# Patient Record
Sex: Female | Born: 1995
Health system: Southern US, Community
[De-identification: ages and names within clinical notes are randomized; demographics above are authoritative.]

## PROBLEM LIST (undated history)

## (undated) DIAGNOSIS — F909 Attention-deficit hyperactivity disorder, unspecified type: Secondary | ICD-10-CM

## (undated) DIAGNOSIS — A6000 Herpesviral infection of urogenital system, unspecified: Secondary | ICD-10-CM

## (undated) DIAGNOSIS — F5102 Adjustment insomnia: Secondary | ICD-10-CM

## (undated) DIAGNOSIS — N390 Urinary tract infection, site not specified: Secondary | ICD-10-CM

## (undated) HISTORY — DX: Adjustment insomnia: F51.02

## (undated) HISTORY — DX: Herpesviral infection of urogenital system, unspecified: A60.00

## (undated) HISTORY — DX: Attention-deficit hyperactivity disorder, unspecified type: F90.9

## (undated) HISTORY — DX: Urinary tract infection, site not specified: N39.0

---
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
## 2014-11-09 ENCOUNTER — Encounter (INDEPENDENT_AMBULATORY_CARE_PROVIDER_SITE_OTHER): Payer: Self-pay

## 2014-11-09 ENCOUNTER — Ambulatory Visit (INDEPENDENT_AMBULATORY_CARE_PROVIDER_SITE_OTHER): Payer: BLUE CROSS/BLUE SHIELD | Admitting: Family Medicine

## 2014-11-09 ENCOUNTER — Encounter: Payer: Self-pay | Admitting: Family Medicine

## 2014-11-09 VITALS — BP 110/72 | HR 100 | Temp 97.7°F | Resp 16 | Ht 63.0 in | Wt 116.1 lb

## 2014-11-09 DIAGNOSIS — R300 Dysuria: Secondary | ICD-10-CM | POA: Diagnosis not present

## 2014-11-09 DIAGNOSIS — N390 Urinary tract infection, site not specified: Secondary | ICD-10-CM

## 2014-11-09 DIAGNOSIS — G4700 Insomnia, unspecified: Secondary | ICD-10-CM | POA: Diagnosis not present

## 2014-11-09 MED ORDER — TRAZODONE HCL 50 MG PO TABS: 25.0000 mg | ORAL_TABLET | Freq: Every evening | ORAL | Status: DC | PRN

## 2014-11-09 MED ORDER — NITROFURANTOIN 25 MG/5ML PO SUSP: 100.0000 mg | Freq: Every day | ORAL | Status: DC

## 2014-11-09 NOTE — Patient Instructions (Addendum)
Use Nitrofurantoin medication twice a day for 3 days and then back down to 1 time a day then take as needed if having symptoms in the future.  Urinary Tract Infection Urinary tract infections (UTIs) can develop anywhere along your urinary tract. Your urinary tract is your body's drainage system for removing wastes and extra water. Your urinary tract includes two kidneys, two ureters, a bladder, and a urethra. Your kidneys are a pair of bean-shaped organs. Each kidney is about the size of your fist. They are located below your ribs, one on each side of your spine. CAUSES Infections are caused by microbes, which are microscopic organisms, including fungi, viruses, and bacteria. These organisms are so small that they can only be seen through a microscope. Bacteria are the microbes that most commonly cause UTIs. SYMPTOMS  Symptoms of UTIs may vary by age and gender of the patient and by the location of the infection. Symptoms in young women typically include a frequent and intense urge to urinate and a painful, burning feeling in the bladder or urethra during urination. Older women and men are more likely to be tired, shaky, and weak and have muscle aches and abdominal pain. A fever may mean the infection is in your kidneys. Other symptoms of a kidney infection include pain in your back or sides below the ribs, nausea, and vomiting. DIAGNOSIS To diagnose a UTI, your caregiver will ask you about your symptoms. Your caregiver will also ask you to provide a urine sample. The urine sample will be tested for bacteria and white blood cells. White blood cells are made by your body to help fight infection. TREATMENT  Typically, UTIs can be treated with medication. Because most UTIs are caused by a bacterial infection, they usually can be treated with the use of antibiotics. The choice of antibiotic and length of treatment depend on your symptoms and the type of bacteria causing your infection. HOME CARE  INSTRUCTIONS  If you were prescribed antibiotics, take them exactly as your caregiver instructs you. Finish the medication even if you feel better after you have only taken some of the medication.  Drink enough water and fluids to keep your urine clear or pale yellow.  Avoid caffeine, tea, and carbonated beverages. They tend to irritate your bladder.  Empty your bladder often. Avoid holding urine for long periods of time.  Empty your bladder before and after sexual intercourse.  After a bowel movement, women should cleanse from front to back. Use each tissue only once. SEEK MEDICAL CARE IF:   You have back pain.  You develop a fever.  Your symptoms do not begin to resolve within 3 days. SEEK IMMEDIATE MEDICAL CARE IF:   You have severe back pain or lower abdominal pain.  You develop chills.  You have nausea or vomiting.  You have continued burning or discomfort with urination. MAKE SURE YOU:   Understand these instructions.  Will watch your condition.  Will get help right away if you are not doing well or get worse.   This information is not intended to replace advice given to you by your health care provider. Make sure you discuss any questions you have with your health care provider.   Document Released: 10/05/2004 Document Revised: 09/16/2014 Document Reviewed: 02/03/2011 Elsevier Interactive Patient Education [employer]2016 [employer]vier Inc.

## 2014-11-09 NOTE — Progress Notes (Signed)
Name: Allison Oliver   MRN: [id number]    DOB: 03/01/1995   Date:11/09/2014       Progress Note  Subjective  Chief Complaint  Chief Complaint  Patient presents with  . Establish Care  . Medication Refill    HPI  Allison Oliver is a 19 y.o. female here today to transition care of medical needs to a primary care provider. She reports having some urinary symptoms in the past with chronic UTIs and was evaluated by Urology specialists and placed on a medication (name unknown to patient) which she ran out of around March 2016 and since then reports gradual reoccur ance of the same symptoms. Symptoms are not every day but include pelvic pressure, lower back pain, burning at the end of voiding, increased urge and frequency without incomplete voiding or incontinence. Has had cystoscopy and CT scan which did not find kidney stones, pelvic mass or anything abnormal she is aware of.   Otherwise she complains of being anxious and having poor sleep some nights. Feels like she shuts down when she is in a group of people. Tried melatonin OTC did not help much.    Active Ambulatory Problems    Diagnosis Date Noted  . Dysuria 11/09/2014  . Chronic UTI (urinary tract infection) 11/09/2014   Resolved Ambulatory Problems    Diagnosis Date Noted  . No Resolved Ambulatory Problems   Past Medical History  Diagnosis Date  . ADHD (attention deficit hyperactivity disorder)   . Insomnia due to stress     Social History  Substance Use Topics  . Smoking status: Never Smoker   . Smokeless tobacco: Not on file  . Alcohol Use: No    No current outpatient prescriptions on file.  History reviewed. No pertinent past surgical history.  Family History  Problem Relation Age of Onset  . Drug abuse Father     No Known Allergies   Review of Systems  CONSTITUTIONAL: No significant weight changes, fever, chills, weakness or fatigue.  HEENT:  - Eyes: No visual changes.  - Ears: No auditory  changes. No pain.  - Nose: No sneezing, congestion, runny nose. - Throat: No sore throat. No changes in swallowing. SKIN: No rash or itching.  CARDIOVASCULAR: No chest pain, chest pressure or chest discomfort. No palpitations or edema.  RESPIRATORY: No shortness of breath, cough or sputum.  GASTROINTESTINAL: No anorexia, nausea, vomiting. No changes in bowel habits. No abdominal pain or blood.  GENITOURINARY: Yes dysuria. Yes frequency. No discharge. NEUROLOGICAL: No headache, dizziness, syncope, paralysis, ataxia, numbness or tingling in the extremities. No memory changes. No change in bowel or bladder control.  MUSCULOSKELETAL: No joint pain. No muscle pain. HEMATOLOGIC: No anemia, bleeding or bruising.  LYMPHATICS: No enlarged lymph nodes.  PSYCHIATRIC: No change in mood. Yes change in sleep pattern.  ENDOCRINOLOGIC: No reports of sweating, cold or heat intolerance. No polyuria or polydipsia.     Objective  BP 110/72 mmHg  Pulse 100  Temp(Src) 97.7 F (36.5 C) (Oral)  Resp 16  Ht 5\' 3"  (1.6 m)  Wt 116 lb 1.6 oz (52.663 kg)  BMI 20.57 kg/m2  SpO2 97%  LMP 11/02/2014 (Exact Date) Body mass index is 20.57 kg/(m^2).  Physical Exam  Constitutional: Patient appears well-developed and well-nourished. In no distress.  HEENT:  - Head: Normocephalic and atraumatic.  - Ears: Bilateral TMs gray, no erythema or effusion - Nose: Nasal mucosa moist - Mouth/Throat: Oropharynx is clear and moist. No tonsillar hypertrophy or erythema.  No post nasal drainage.  - Eyes: Conjunctivae clear, EOM movements normal. PERRLA. No scleral icterus.  Neck: Normal range of motion. Neck supple. No JVD present. No thyromegaly present.  Cardiovascular: Normal rate, regular rhythm and normal heart sounds.  No murmur heard.  Pulmonary/Chest: Effort normal and breath sounds normal. No respiratory distress. Abdomen: Soft, normal bowel sounds in all 4 quadrants. Some tenderness to suprapubic  palpation. Musculoskeletal: Normal range of motion bilateral UE and LE, no joint effusions. Peripheral vascular: Bilateral LE no edema. Neurological: CN II-XII grossly intact with no focal deficits. Alert and oriented to person, place, and time. Coordination, balance, strength, speech and gait are normal.  Skin: Skin is warm and dry. No rash noted. No erythema.  Psychiatric: Patient has a normal mood and affect. Behavior is normal in office today. Judgment and thought content normal in office today.  Assessment & Plan  1. Dysuria Confirmed previous Rx with [facility]. I discussed my concern that she has been on chronic abx and resistance may build up. I will culture her urine today and will await previous records for review and consider 2nd opinion from urology specialty.  - nitrofurantoin (FURADANTIN) 25 MG/5ML suspension; Take 20 mLs (100 mg total) by mouth at bedtime.  Dispense: 230 mL; Refill: 2 - Urine Culture  2. Chronic UTI (urinary tract infection) Need to review previous records. May need second opinion near future. Consider interstitial cystitis as a diagnosis that needs to be ruled out.  - nitrofurantoin (FURADANTIN) 25 MG/5ML suspension; Take 20 mLs (100 mg total) by mouth at bedtime.  Dispense: 230 mL; Refill: 2  3. Insomnia Sleep hygiene recommended.  - traZODone (DESYREL) 50 MG tablet; Take 0.5-1 tablets (25-50 mg total) by mouth at bedtime as needed for sleep.  Dispense: 30 tablet; Refill: 3

## 2014-11-12 LAB — URINE CULTURE

## 2015-06-14 ENCOUNTER — Encounter: Payer: Self-pay | Admitting: Family Medicine

## 2015-06-14 ENCOUNTER — Ambulatory Visit (INDEPENDENT_AMBULATORY_CARE_PROVIDER_SITE_OTHER): Payer: BLUE CROSS/BLUE SHIELD | Admitting: Family Medicine

## 2015-06-14 VITALS — BP 118/74 | HR 95 | Temp 98.7°F | Resp 14 | Wt 118.0 lb

## 2015-06-14 DIAGNOSIS — O98319 Other infections with a predominantly sexual mode of transmission complicating pregnancy, unspecified trimester: Secondary | ICD-10-CM | POA: Insufficient documentation

## 2015-06-14 DIAGNOSIS — F410 Panic disorder [episodic paroxysmal anxiety] without agoraphobia: Secondary | ICD-10-CM

## 2015-06-14 DIAGNOSIS — B009 Herpesviral infection, unspecified: Secondary | ICD-10-CM | POA: Diagnosis not present

## 2015-06-14 DIAGNOSIS — A6009 Herpesviral infection of other urogenital tract: Secondary | ICD-10-CM | POA: Insufficient documentation

## 2015-06-14 MED ORDER — VALACYCLOVIR HCL 500 MG PO TABS: ORAL_TABLET | ORAL | Status: DC

## 2015-06-14 NOTE — Patient Instructions (Addendum)
Consider L-lysine or B complex stress tabs to help avoid outbreaks Start the new medicine for prevention, but always be safe Check out the [facility] website HairSlick.nohttps://www.cdc.gov/std/herpes/default.htm

## 2015-06-14 NOTE — Progress Notes (Signed)
BP 118/74 mmHg  Pulse 95  Temp(Src) 98.7 F (37.1 C) (Oral)  Resp 14  Wt 118 lb (53.524 kg)  SpO2 96%  LMP 06/14/2015 (Approximate)   Subjective:    Patient ID: Allison Oliver, female    DOB: 04/22/1995, 20 y.o.   MRN: [id number]  HPI: Allison Oliver is a 20 y.o. female  Chief Complaint  Patient presents with  . Exposure to STD    Was diagnosed with herpes through urgent care wants to see about getting on daily medication   She found out a few weeks ago that she had herpes; she went to urgent care; had genital herpes Little bit of discharge still, little discharge and they swabbed her; no results Gigantic blue pills, valacyclovir 1 gram TID for one week She was really worried; waited from Monday to Thursday, just waiting on the results No abuse Not pregnant, started period yesterday Recurrent UTIs; had cystoscopy; had a UTI at urgent care, finished out medicine for that  About two weeks, at a softball game, had ringing noises in her ears; literally thought I was dying she says, sweating, vision got blurry, so scared; went home, told her mom about it; was at a softball game; dropped her phone, almost phone; calmed down when she got in the care; keeps getting a ringing in her ears; never had a panic attack; no idea what it could be; not sure if just worrying herself; heart was just racing; sister has anxiety; she told her sister and she said that's how she felt, thought she had a panic attack  Depression screen [facility] 2/9 06/14/2015 11/09/2014  Decreased Interest 0 0  Down, Depressed, Hopeless 1 0  PHQ - 2 Score 1 0   Relevant past medical, surgical, family and social history reviewed Past Medical History  Diagnosis Date  . ADHD (attention deficit hyperactivity disorder)     history of  . Insomnia due to stress   . Chronic UTI (urinary tract infection)    History reviewed. No pertinent past surgical history. Family History  Problem Relation Age of Onset  . Drug abuse  Father    Social History  Substance Use Topics  . Smoking status: Never Smoker   . Smokeless tobacco: None  . Alcohol Use: No   Interim medical history since last visit reviewed. Allergies and medications reviewed  Review of Systems Per HPI unless specifically indicated above     Objective:    BP 118/74 mmHg  Pulse 95  Temp(Src) 98.7 F (37.1 C) (Oral)  Resp 14  Wt 118 lb (53.524 kg)  SpO2 96%  LMP 06/14/2015 (Approximate)  Wt Readings from Last 3 Encounters:  06/14/15 118 lb (53.524 kg)  11/09/14 116 lb 1.6 oz (52.663 kg)    Physical Exam  Constitutional: She appears well-developed and well-nourished.  Cardiovascular: Normal rate and regular rhythm.   No extrasystoles are present. Exam reveals no gallop and no friction rub.   No murmur heard. Pulmonary/Chest: Effort normal and breath sounds normal.  Neurological: She displays no tremor.  No tics  Skin: No pallor.  Psychiatric: Her mood appears not anxious. She does not exhibit a depressed mood.    Results for orders placed or performed in visit on 11/09/14  Urine Culture  Result Value Ref Range   Urine Culture, Routine Final report (A)    Urine Culture result 1 Escherichia coli (A)    ANTIMICROBIAL SUSCEPTIBILITY Comment       Assessment & Plan:  Problem List Items Addressed This Visit      Other   Panic attack    Sounds very likely that she suffered a panic attack; discussed stress reduction; may return if recurs      Herpes simplex infection - Primary    [facility]; discussed preventive medicine, usually reserved for patients with frequent outbreaks but she wishes to start now; explained can still be spread, but may reduce risk of spread; still reocmmend barrier (condom use) and avoidance of any contact with active outbreak; reiterated can still be spread even on medicine and without active lesions; stress reduction, L-lysine, B stress tabs, etc discussed      Relevant Medications    valACYclovir (VALTREX) 500 MG tablet      Follow up plan: Return in about 12 weeks (around 09/06/2015) [facility] repeat testing [facility]  An after-visit summary was printed and given to the patient at check-out.  Please see the patient instructions which may contain other information and recommendations beyond what is mentioned above in the assessment and plan.  Meds ordered this encounter  Medications  . valACYclovir (VALTREX) 500 MG tablet    Sig: One by mouth daily for prevention; if outbreak, take one pill twice a day for 3 days    Dispense:  33 tablet    Refill:  11

## 2015-07-10 DIAGNOSIS — F410 Panic disorder [episodic paroxysmal anxiety] without agoraphobia: Secondary | ICD-10-CM | POA: Insufficient documentation

## 2015-07-10 NOTE — Assessment & Plan Note (Signed)
[facility]; discussed preventive medicine, usually reserved for patients with frequent outbreaks but she wishes to start now; explained can still be spread, but may reduce risk of spread; still reocmmend barrier (condom use) and avoidance of any contact with active outbreak; reiterated can still be spread even on medicine and without active lesions; stress reduction, L-lysine, B stress tabs, etc discussed

## 2015-07-10 NOTE — Assessment & Plan Note (Signed)
Sounds very likely that she suffered a panic attack; discussed stress reduction; may return if recurs

## 2015-07-12 ENCOUNTER — Encounter: Payer: Self-pay | Admitting: Family Medicine

## 2015-07-15 ENCOUNTER — Encounter: Payer: Self-pay | Admitting: Emergency Medicine

## 2015-07-15 DIAGNOSIS — F1290 Cannabis use, unspecified, uncomplicated: Secondary | ICD-10-CM | POA: Diagnosis not present

## 2015-07-15 DIAGNOSIS — F909 Attention-deficit hyperactivity disorder, unspecified type: Secondary | ICD-10-CM | POA: Insufficient documentation

## 2015-07-15 DIAGNOSIS — R1031 Right lower quadrant pain: Secondary | ICD-10-CM | POA: Diagnosis present

## 2015-07-15 DIAGNOSIS — N12 Tubulo-interstitial nephritis, not specified as acute or chronic: Secondary | ICD-10-CM | POA: Insufficient documentation

## 2015-07-15 LAB — COMPREHENSIVE METABOLIC PANEL
ALBUMIN: 5.2 g/dL — AB (ref 3.5–5.0)
ALT: 25 U/L (ref 14–54)
ANION GAP: 10 (ref 5–15)
AST: 41 U/L (ref 15–41)
Alkaline Phosphatase: 73 U/L (ref 38–126)
BILIRUBIN TOTAL: 1.4 mg/dL — AB (ref 0.3–1.2)
BUN: 12 mg/dL (ref 6–20)
CO2: 26 mmol/L (ref 22–32)
Calcium: 9.8 mg/dL (ref 8.9–10.3)
Chloride: 103 mmol/L (ref 101–111)
Creatinine, Ser: 0.93 mg/dL (ref 0.44–1.00)
GFR calc non Af Amer: >60 mL/min (ref >60)
GLUCOSE: 85 mg/dL (ref 65–99)
POTASSIUM: 4.4 mmol/L (ref 3.5–5.1)
SODIUM: 139 mmol/L (ref 135–145)
TOTAL PROTEIN: 9.3 g/dL — AB (ref 6.5–8.1)

## 2015-07-15 LAB — CBC
HEMATOCRIT: 47.8 % — AB (ref 35.0–47.0)
HEMOGLOBIN: 16.1 g/dL — AB (ref 12.0–16.0)
MCH: 31.3 pg (ref 26.0–34.0)
MCHC: 33.7 g/dL (ref 32.0–36.0)
MCV: 92.8 fL (ref 80.0–100.0)
Platelets: 283 10*3/uL (ref 150–440)
RBC: 5.15 MIL/uL (ref 3.80–5.20)
RDW: 12.9 % (ref 11.5–14.5)
WBC: 13.7 10*3/uL — ABNORMAL HIGH (ref 3.6–11.0)

## 2015-07-15 LAB — URINALYSIS COMPLETE WITH MICROSCOPIC (ARMC ONLY)
BACTERIA UA: NONE SEEN
Bilirubin Urine: NEGATIVE
Glucose, UA: NEGATIVE mg/dL
Ketones, ur: NEGATIVE mg/dL
Nitrite: NEGATIVE
PH: 7 (ref 5.0–8.0)
PROTEIN: 30 mg/dL — AB
Specific Gravity, Urine: 1.015 (ref 1.005–1.030)

## 2015-07-15 LAB — LIPASE, BLOOD: Lipase: 20 U/L (ref 11–51)

## 2015-07-15 LAB — PREGNANCY, URINE: Preg Test, Ur: NEGATIVE

## 2015-07-15 NOTE — ED Notes (Signed)
Pt arrived to the ED accompanied by her mother for complaints of right flank pain. Pt states that the pain started a couple of hours ago and has progressively gotten worse. Pt reports urinary discomfort. Pt is AOx4 in moderate pain distress.

## 2015-07-16 ENCOUNTER — Emergency Department: Payer: BLUE CROSS/BLUE SHIELD

## 2015-07-16 ENCOUNTER — Emergency Department
Admission: EM | Admit: 2015-07-16 | Discharge: 2015-07-16 | Disposition: A | Discharge status: Home / Self Care | Payer: BLUE CROSS/BLUE SHIELD | Attending: Emergency Medicine | Admitting: Emergency Medicine

## 2015-07-16 DIAGNOSIS — N12 Tubulo-interstitial nephritis, not specified as acute or chronic: Secondary | ICD-10-CM

## 2015-07-16 MED ORDER — ONDANSETRON HCL 4 MG/2ML IJ SOLN
4.0000 mg | Freq: Once | INTRAMUSCULAR | Status: AC
  Administered 2015-07-16: 4 mg via INTRAVENOUS
  Filled 2015-07-16: qty 2

## 2015-07-16 MED ORDER — MORPHINE SULFATE (PF) 2 MG/ML IV SOLN
2.0000 mg | Freq: Once | INTRAVENOUS | Status: AC
Start: 2015-07-16 — End: 2015-07-16
  Administered 2015-07-16: 2 mg via INTRAVENOUS
  Filled 2015-07-16: qty 1

## 2015-07-16 MED ORDER — CIPROFLOXACIN HCL 500 MG PO TABS
500.0000 mg | ORAL_TABLET | Freq: Two times a day (BID) | ORAL | Status: AC
Start: 2015-07-16 — End: 2015-07-26

## 2015-07-16 MED ORDER — DIATRIZOATE MEGLUMINE & SODIUM 66-10 % PO SOLN
15.0000 mL | ORAL | Status: AC
  Administered 2015-07-16: 15 mL via ORAL

## 2015-07-16 MED ORDER — IOPAMIDOL (ISOVUE-300) INJECTION 61%
100.0000 mL | Freq: Once | INTRAVENOUS | Status: AC | PRN
  Administered 2015-07-16: 100 mL via INTRAVENOUS

## 2015-07-16 MED ORDER — DEXTROSE 5 % IV SOLN
1.0000 g | Freq: Once | INTRAVENOUS | Status: AC
  Administered 2015-07-16: 1 g via INTRAVENOUS
  Filled 2015-07-16: qty 10

## 2015-07-16 NOTE — Discharge Instructions (Signed)

## 2015-07-16 NOTE — ED Notes (Signed)
Pt tender to palpation of right lower abdomen, and mildly tender to right mid/lateral back

## 2015-07-16 NOTE — ED Provider Notes (Signed)
[facility] Emergency Department Provider Note  ____________________________________________  Time seen: 1:45 AM  I have reviewed the triage vital signs and the nursing notes.   HISTORY  Chief Complaint Flank Pain      HPI Allison Oliver is a 20 y.o. female presents with right flank pain currently 10 out of 10 with acute onset 2 hours before presentation to the emergency department. Patient admits to nausea denies any vomiting. Patient admits to dysuria one week ago that has since resolved but reoccurred today. Patient admits to chills earlier this week with subjective fevers.     Past Medical History  Diagnosis Date  . ADHD (attention deficit hyperactivity disorder)     history of  . Insomnia due to stress   . Chronic UTI (urinary tract infection)     Patient Active Problem List   Diagnosis Date Noted  . Panic attack 07/10/2015  . Herpes simplex infection 06/14/2015  . Chronic UTI (urinary tract infection) 11/09/2014  . Insomnia 11/09/2014    History reviewed. No pertinent past surgical history.  Current Outpatient Rx  Name  Route  Sig  Dispense  Refill  . valACYclovir (VALTREX) 500 MG tablet      One by mouth daily for prevention; if outbreak, take one pill twice a day for 3 days   33 tablet   11     Allergies No known drug allergies  Family History  Problem Relation Age of Onset  . Drug abuse Father     Social History Social History  Substance Use Topics  . Smoking status: Never Smoker   . Smokeless tobacco: None  . Alcohol Use: 0.0 oz/week    0 Standard drinks or equivalent per week    Review of Systems  Constitutional: Negative for fever. Eyes: Negative for visual changes. ENT: Negative for sore throat. Cardiovascular: Negative for chest pain. Respiratory: Negative for shortness of breath. Gastrointestinal: Positive for abdominal pain Genitourinary: Positive for dysuria. Musculoskeletal: Negative for back  pain. Skin: Negative for rash. Neurological: Negative for headaches, focal weakness or numbness.   10-point ROS otherwise negative.  ____________________________________________   PHYSICAL EXAM:  VITAL SIGNS: ED Triage Vitals  Enc Vitals Group     BP 07/15/15 2242 144/96 mmHg     Pulse Rate 07/15/15 2242 132     Resp 07/15/15 2242 18     Temp 07/15/15 2242 98.4 F (36.9 C)     Temp Source 07/15/15 2242 Oral     SpO2 07/15/15 2242 100 %     Weight 07/15/15 2242 118 lb (53.524 kg)     Height 07/15/15 2242 5\' 1"  (1.549 m)     Head Cir --      Peak Flow --      Pain Score 07/15/15 2243 8     Pain Loc --      Pain Edu? --      Excl. in GC? --     Constitutional: Alert and oriented. Well appearing and in no distress. Eyes: Conjunctivae are normal. PERRL. Normal extraocular movements. ENT   Head: Normocephalic and atraumatic.   Nose: No congestion/rhinnorhea.   Mouth/Throat: Mucous membranes are moist.   Neck: No stridor. Hematological/Lymphatic/Immunilogical: No cervical lymphadenopathy. Cardiovascular: Normal rate, regular rhythm. Normal and symmetric distal pulses are present in all extremities. No murmurs, rubs, or gallops. Respiratory: Normal respiratory effort without tachypnea nor retractions. Breath sounds are clear and equal bilaterally. No wheezes/rales/rhonchi. Gastrointestinal: Right upper quadrant/right lower quadrant tenderness to palpation  No distention. There is no CVA tenderness. Genitourinary: deferred Musculoskeletal: Nontender with normal range of motion in all extremities. No joint effusions.  No lower extremity tenderness nor edema. Neurologic:  Normal speech and language. No gross focal neurologic deficits are appreciated. Speech is normal.  Skin:  Skin is warm, dry and intact. No rash noted. Psychiatric: Mood and affect are normal. Speech and behavior are normal. Patient exhibits appropriate insight and  judgment.  ____________________________________________    LABS (pertinent positives/negatives)  Labs Reviewed  COMPREHENSIVE METABOLIC PANEL - Abnormal; Notable for the following:    Total Protein 9.3 (*)    Albumin 5.2 (*)    Total Bilirubin 1.4 (*)    All other components within normal limits  CBC - Abnormal; Notable for the following:    WBC 13.7 (*)    Hemoglobin 16.1 (*)    HCT 47.8 (*)    All other components within normal limits  URINALYSIS COMPLETEWITH MICROSCOPIC ([facility] ONLY) - Abnormal; Notable for the following:    Color, Urine YELLOW (*)    APPearance HAZY (*)    Hgb urine dipstick 3+ (*)    Protein, ur 30 (*)    Leukocytes, UA 1+ (*)    Squamous Epithelial / LPF 0-5 (*)    All other components within normal limits  LIPASE, BLOOD  PREGNANCY, URINE       RADIOLOGY T Abdomen Pelvis W Contrast (Final result) Result time: 07/16/15 04:04:51   Final result by Rad Results In Interface (07/16/15 04:04:51)   Narrative:   CLINICAL DATA: Right lower quadrant pain and nausea. Fever and chills.  EXAM: CT ABDOMEN AND PELVIS WITH CONTRAST  TECHNIQUE: Multidetector CT imaging of the abdomen and pelvis was performed using the standard protocol following bolus administration of intravenous contrast.  CONTRAST: 100mL ISOVUE-300 IOPAMIDOL (ISOVUE-300) INJECTION 61%  COMPARISON: CT 12/20/2012  FINDINGS: Lower chest: The included lung bases are clear. No pleural fluid.  Liver: Normal.  Hepatobiliary: Gallbladder physiologically distended, no calcified stone. No biliary dilatation.  Pancreas: Normal.  Spleen: Normal.  Adrenal glands: No nodule.  Kidneys: Right perinephric edema and ureteral enhancement. No perirenal or intrarenal fluid collection or abscess. Mildly heterogeneous perfusion of the right kidney. No frank hydronephrosis. Left kidney is normal.  Stomach/Bowel: Stomach is distended with ingested contrast. There are no dilated or  thickened small bowel loops. Small volume of stool throughout the colon without colonic wall thickening.  The appendix is normal.  Vascular/Lymphatic: No retroperitoneal adenopathy. Abdominal aorta is normal in caliber.  Reproductive: Uterus appears normal. Ovaries symmetric in size. Tampon in place. Small volume free fluid is physiologic.  Bladder: Physiologically distended without wall thickening.  Other: No free air, abdominal ascites, or intra-abdominal fluid collection.  Musculoskeletal: There are no acute or suspicious osseous abnormalities.  IMPRESSION: 1. Right pyelonephritis. 2. Normal appendix.   Electronically Signed By: Rubye OaksMelanie Ehinger M.D. On: 07/16/2015 04:04     Procedures     INITIAL IMPRESSION / ASSESSMENT AND PLAN / ED COURSE  Pertinent labs & imaging results that were available during my care of the patient were reviewed by me and considered in my medical decision making (see chart for details).   Patient given IV ceftriaxone 1 g emergency department will be prescribed Cipro for home. I spoke with the patient and her mother at length regarding warning signs for return to the emergency department ____________________________________________   FINAL CLINICAL IMPRESSION(S) / ED DIAGNOSES  Final diagnoses:  Emphysematous pyelonephritis of right kidney (HCC)  Darci Current, MD 07/16/15 [phone]

## 2015-07-16 NOTE — ED Notes (Signed)
Pt reports her abdominal pain decreases when she applies pressure to her right lower abdomen, and increases when she releases the pressure. Pt reports she has been applying pressure to her abdomen all day to help with pain

## 2015-07-16 NOTE — ED Notes (Signed)
Patient transported to CT 

## 2015-07-16 NOTE — ED Notes (Signed)
MD Brown at bedside.

## 2015-07-16 NOTE — ED Notes (Signed)
Pt c/o of right flank - mid/lateral abdominal pain. Pt reports hx of UTI. Pt reports pain began today. Pt reports having fever/chills earlier in the week

## 2015-09-07 ENCOUNTER — Ambulatory Visit (INDEPENDENT_AMBULATORY_CARE_PROVIDER_SITE_OTHER): Payer: BLUE CROSS/BLUE SHIELD | Admitting: Family Medicine

## 2015-09-07 ENCOUNTER — Encounter: Payer: Self-pay | Admitting: Family Medicine

## 2015-09-07 VITALS — BP 112/72 | HR 86 | Temp 97.8°F | Wt 115.0 lb

## 2015-09-07 DIAGNOSIS — Z114 Encounter for screening for human immunodeficiency virus [HIV]: Secondary | ICD-10-CM | POA: Diagnosis not present

## 2015-09-07 DIAGNOSIS — R300 Dysuria: Secondary | ICD-10-CM | POA: Diagnosis not present

## 2015-09-07 DIAGNOSIS — Z113 Encounter for screening for infections with a predominantly sexual mode of transmission: Secondary | ICD-10-CM | POA: Insufficient documentation

## 2015-09-07 DIAGNOSIS — Z23 Encounter for immunization: Secondary | ICD-10-CM | POA: Diagnosis not present

## 2015-09-07 DIAGNOSIS — L708 Other acne: Secondary | ICD-10-CM

## 2015-09-07 DIAGNOSIS — L709 Acne, unspecified: Secondary | ICD-10-CM | POA: Insufficient documentation

## 2015-09-07 DIAGNOSIS — M26629 Arthralgia of temporomandibular joint, unspecified side: Secondary | ICD-10-CM

## 2015-09-07 DIAGNOSIS — B009 Herpesviral infection, unspecified: Secondary | ICD-10-CM

## 2015-09-07 MED ORDER — NITROFURANTOIN MONOHYD MACRO 100 MG PO CAPS: 100.0000 mg | ORAL_CAPSULE | Freq: Two times a day (BID) | ORAL | 0 refills | Status: AC

## 2015-09-07 NOTE — Assessment & Plan Note (Signed)
Check GC, chl

## 2015-09-07 NOTE — Assessment & Plan Note (Addendum)
Refer to dermatologist at patient's request; avoid picking and popping; use soap (mild) and water after exercise

## 2015-09-07 NOTE — Progress Notes (Signed)
BP 112/72   Pulse 86   Temp 97.8 F (36.6 C) (Oral)   Wt 115 lb (52.2 kg)   LMP 09/04/2015   SpO2 99%   BMI 21.73 kg/m    Subjective:    Patient ID: Allison EwingsElizabeth B Bacorn, female    DOB: 06/28/1995, 20 y.o.   MRN: [id number]  HPI: Allison Oliver is a 20 y.o. female  Chief Complaint  Patient presents with  . Follow-up    resting for herpes   She has been having pain in the left ear; hurts to chew food on the left, popping and uncomfortable; two weeks duration; nothing similar before; wondered if wisdom teeth coming in; can hear fine; no fever; no sinus drainage  She does not break out usually, but having acne and fine bumps on the cheeks and forehead; would like referral to dermatologist; sometimes arms; does exercise  She thinks she has a UTI; has had them so she knows when she gets them; she went to the ER and had a kidney infection; has been taking AZO; no flank pain, no suprapubic pressure; was in so much pain with the July episode; side was just hurting so bad; went to the ER; reviewed urine  She has had two small outbreaks since last visit  [facility] 09/07/2015 06/14/2015 11/09/2014  Decreased Interest 0 0 0  Down, Depressed, Hopeless 0 1 0  PHQ - 2 Score 0 1 0   Relevant past medical, surgical, family and social history reviewed Past Medical History:  Diagnosis Date  . ADHD (attention deficit hyperactivity disorder)    history of  . Chronic UTI (urinary tract infection)   . Insomnia due to stress    No past surgical history on file. Family History  Problem Relation Age of Onset  . Drug abuse Father    Social History  Substance Use Topics  . Smoking status: Never Smoker  . Smokeless tobacco: Not on file  . Alcohol use 0.0 oz/week   Interim medical history since last visit reviewed. Allergies and medications reviewed  Review of Systems Per HPI unless specifically indicated above     Objective:    BP 112/72   Pulse 86   Temp 97.8 F  (36.6 C) (Oral)   Wt 115 lb (52.2 kg)   LMP 09/04/2015   SpO2 99%   BMI 21.73 kg/m   Wt Readings from Last 3 Encounters:  09/07/15 115 lb (52.2 kg)  07/15/15 118 lb (53.5 kg)  06/14/15 118 lb (53.5 kg)    Physical Exam  Constitutional: She appears well-developed and well-nourished.  HENT:  Head: Normocephalic and atraumatic.  Right Ear: Hearing, tympanic membrane, external ear and ear canal normal. No drainage. Tympanic membrane is not erythematous. No middle ear effusion.  Left Ear: Hearing, tympanic membrane, external ear and ear canal normal. No drainage. Tympanic membrane is not erythematous.  No middle ear effusion.  Nose: Nose normal. No mucosal edema or rhinorrhea.  Mouth/Throat: Oropharynx is clear and moist and mucous membranes are normal. No oral lesions. Normal dentition. No dental caries. No oropharyngeal exudate.  Left side jaw/TMJ closes slightly before the right side; no click  Eyes: EOM are normal. No scleral icterus.  Cardiovascular: Normal rate and regular rhythm.   Pulmonary/Chest: Effort normal and breath sounds normal.  Lymphadenopathy:    She has no cervical adenopathy.  Skin:  Very fine papular acne on cheeks  Psychiatric: She has a normal mood and affect. Her behavior is  normal.   Results for orders placed or performed during the hospital encounter of 07/16/15  Comprehensive metabolic panel  Result Value Ref Range   Sodium 139 135 - 145 mmol/L   Potassium 4.4 3.5 - 5.1 mmol/L   Chloride 103 101 - 111 mmol/L   CO2 26 22 - 32 mmol/L   Glucose, Bld 85 65 - 99 mg/dL   BUN 12 6 - 20 mg/dL   Creatinine, Ser 5.78 0.44 - 1.00 mg/dL   Calcium 9.8 8.9 - 46.9 mg/dL   Total Protein 9.3 (H) 6.5 - 8.1 g/dL   Albumin 5.2 (H) 3.5 - 5.0 g/dL   AST 41 15 - 41 U/L   ALT 25 14 - 54 U/L   Alkaline Phosphatase 73 38 - 126 U/L   Total Bilirubin 1.4 (H) 0.3 - 1.2 mg/dL   GFR calc non Af Amer >60 >60 mL/min   GFR calc Af Amer >60 >60 mL/min   Anion gap 10 5 - 15  CBC    Result Value Ref Range   WBC 13.7 (H) 3.6 - 11.0 K/uL   RBC 5.15 3.80 - 5.20 MIL/uL   Hemoglobin 16.1 (H) 12.0 - 16.0 g/dL   HCT 62.9 (H) 52.8 - 41.3 %   MCV 92.8 80.0 - 100.0 fL   MCH 31.3 26.0 - 34.0 pg   MCHC 33.7 32.0 - 36.0 g/dL   RDW 24.4 01.0 - 27.2 %   Platelets 283 150 - 440 K/uL  Lipase, blood  Result Value Ref Range   Lipase 20 11 - 51 U/L  Urinalysis complete, with microscopic ([facility] only)  Result Value Ref Range   Color, Urine YELLOW (A) YELLOW   APPearance HAZY (A) CLEAR   Glucose, UA NEGATIVE NEGATIVE mg/dL   Bilirubin Urine NEGATIVE NEGATIVE   Ketones, ur NEGATIVE NEGATIVE mg/dL   Specific Gravity, Urine 1.015 1.005 - 1.030   Hgb urine dipstick 3+ (A) NEGATIVE   pH 7.0 5.0 - 8.0   Protein, ur 30 (A) NEGATIVE mg/dL   Nitrite NEGATIVE NEGATIVE   Leukocytes, UA 1+ (A) NEGATIVE   RBC / HPF TOO NUMEROUS TO COUNT 0 - 5 RBC/hpf   WBC, UA TOO NUMEROUS TO COUNT 0 - 5 WBC/hpf   Bacteria, UA NONE SEEN NONE SEEN   Squamous Epithelial / LPF 0-5 (A) NONE SEEN   Mucous PRESENT   Pregnancy, urine  Result Value Ref Range   Preg Test, Ur NEGATIVE NEGATIVE      Assessment & Plan:   Problem List Items Addressed This Visit      Musculoskeletal and Integument   Acne    Refer to dermatologist at patient's request; avoid picking and popping; use soap (mild) and water after exercise      Relevant Orders   Ambulatory referral to Dermatology     Other   Screen for STD (sexually transmitted disease)    Check GC, chl      Relevant Orders   GC/chlamydia probe amp, urine   Herpes simplex infection    Continue valtrex; consider B complex or L-lysine      Encounter for screening for HIV    Screen HIV      Relevant Orders   HIV antibody   Dysuria - Primary    Check urine and culture; start antibiotics      Relevant Medications   nitrofurantoin, macrocrystal-monohydrate, (MACROBID) 100 MG capsule   Other Relevant Orders   Urinalysis w microscopic + reflex cultur  Other Visit Diagnoses    Needs flu shot       Relevant Orders   Flu Vaccine QUAD 36+ mos PF IM (Fluarix & Fluzone Quad PF) (Completed)   TMJ pain dysfunction syndrome       may be TMJ or wisdom tooth; patient to see her dentist      Follow up plan: Return if symptoms worsen or fail to improve.  An after-visit summary was printed and given to the patient at check-out.  Please see the patient instructions which may contain other information and recommendations beyond what is mentioned above in the assessment and plan.  Meds ordered this encounter  Medications  . nitrofurantoin, macrocrystal-monohydrate, (MACROBID) 100 MG capsule    Sig: Take 1 capsule (100 mg total) by mouth 2 (two) times daily.    Dispense:  6 capsule    Refill:  0    Orders Placed This Encounter  Procedures  . Flu Vaccine QUAD 36+ mos PF IM (Fluarix & Fluzone Quad PF)  . Urinalysis w microscopic + reflex cultur  . HIV antibody  . GC/chlamydia probe amp, urine  . Ambulatory referral to Dermatology

## 2015-09-07 NOTE — Patient Instructions (Addendum)
Try cranberry pills or juice to help prevent urinary tract infection Post-coital voiding  Drink 64-72 ounces of water a day Consider B complex vitamins or L-lysine to help decrease outbreaks Continue Valtrex Start the antibiotic Please do eat yogurt daily or take a probiotic daily for the next month or two We want to replace the healthy germs in the gut If you notice foul, watery diarrhea in the next two months, schedule an appointment RIGHT AWAY Try Aleve or ibuprofen for the jaw discomfort Do see your dentist about the TMJ/jaw discomfort I've referred you to a dermatologist If you have not heard anything from my staff in a week about any orders/referrals/studies from today, please contact us here to follow-up [phone]

## 2015-09-07 NOTE — Assessment & Plan Note (Signed)
Screen HIV

## 2015-09-07 NOTE — Assessment & Plan Note (Signed)
Check urine and culture; start antibiotics

## 2015-09-07 NOTE — Assessment & Plan Note (Signed)
Continue valtrex; consider B complex or L-lysine

## 2015-09-08 ENCOUNTER — Other Ambulatory Visit: Payer: Self-pay | Admitting: Family Medicine

## 2015-09-08 LAB — URINALYSIS W MICROSCOPIC + REFLEX CULTURE
Bilirubin Urine: NEGATIVE
Casts: NONE SEEN [LPF]
Crystals: NONE SEEN [HPF]
Glucose, UA: NEGATIVE
Ketones, ur: NEGATIVE
Nitrite: POSITIVE — AB
Protein, ur: NEGATIVE
Specific Gravity, Urine: 1.01 (ref 1.001–1.035)
Yeast: NONE SEEN [HPF]
pH: 5.5 (ref 5.0–8.0)

## 2015-09-08 LAB — GC/CHLAMYDIA PROBE AMP
CT Probe RNA: NOT DETECTED
GC Probe RNA: NOT DETECTED

## 2015-09-09 LAB — HIV ANTIBODY (ROUTINE TESTING W REFLEX): HIV SCREEN 4TH GENERATION: NONREACTIVE

## 2015-09-10 LAB — URINE CULTURE

## 2016-05-23 ENCOUNTER — Other Ambulatory Visit: Payer: Self-pay | Admitting: Family Medicine

## 2016-05-23 ENCOUNTER — Ambulatory Visit (INDEPENDENT_AMBULATORY_CARE_PROVIDER_SITE_OTHER): Payer: BLUE CROSS/BLUE SHIELD | Admitting: Family Medicine

## 2016-05-23 ENCOUNTER — Encounter: Payer: Self-pay | Admitting: Family Medicine

## 2016-05-23 VITALS — BP 112/64 | HR 97 | Temp 98.0°F | Resp 14 | Wt 109.5 lb

## 2016-05-23 DIAGNOSIS — Z113 Encounter for screening for infections with a predominantly sexual mode of transmission: Secondary | ICD-10-CM

## 2016-05-23 DIAGNOSIS — Z124 Encounter for screening for malignant neoplasm of cervix: Secondary | ICD-10-CM

## 2016-05-23 DIAGNOSIS — N898 Other specified noninflammatory disorders of vagina: Secondary | ICD-10-CM

## 2016-05-23 DIAGNOSIS — N390 Urinary tract infection, site not specified: Secondary | ICD-10-CM

## 2016-05-23 DIAGNOSIS — Z0000 Encounter for general adult medical examination without abnormal findings: Secondary | ICD-10-CM | POA: Diagnosis not present

## 2016-05-23 DIAGNOSIS — B009 Herpesviral infection, unspecified: Secondary | ICD-10-CM

## 2016-05-23 LAB — COMPLETE METABOLIC PANEL WITH GFR
ALBUMIN: 4.4 g/dL (ref 3.6–5.1)
ALK PHOS: 43 U/L (ref 33–115)
ALT: 8 U/L (ref 6–29)
AST: 12 U/L (ref 10–30)
BUN: 14 mg/dL (ref 7–25)
CALCIUM: 9.4 mg/dL (ref 8.6–10.2)
CO2: 19 mmol/L — ABNORMAL LOW (ref 20–31)
Chloride: 107 mmol/L (ref 98–110)
Creat: 0.78 mg/dL (ref 0.50–1.10)
GFR, Est African American: >89 mL/min (ref >=60)
GLUCOSE: 95 mg/dL (ref 65–99)
POTASSIUM: 3.5 mmol/L (ref 3.5–5.3)
SODIUM: 139 mmol/L (ref 135–146)
Total Bilirubin: 0.7 mg/dL (ref 0.2–1.2)
Total Protein: 7.3 g/dL (ref 6.1–8.1)

## 2016-05-23 LAB — LIPID PANEL
CHOL/HDL RATIO: 2 ratio (ref <5.0)
Cholesterol: 146 mg/dL (ref <200)
HDL: 74 mg/dL (ref >50)
LDL Cholesterol: 47 mg/dL (ref <100)
Triglycerides: 123 mg/dL (ref <150)
VLDL: 25 mg/dL (ref <30)

## 2016-05-23 MED ORDER — NITROFURANTOIN MACROCRYSTAL 50 MG PO CAPS: 50.0000 mg | ORAL_CAPSULE | Freq: Every day | ORAL | 5 refills | Status: DC | PRN

## 2016-05-23 MED ORDER — B COMPLEX PO TABS: 1.0000 | ORAL_TABLET | Freq: Every day | ORAL | 11 refills | Status: DC

## 2016-05-23 NOTE — Assessment & Plan Note (Signed)
screen 

## 2016-05-23 NOTE — Progress Notes (Signed)
Patient ID: Allison Oliver, female   DOB: [birth year], 21 y.o.   MRN: [id number]   Subjective:   Allison Oliver is a 21 y.o. female here for a complete physical exam  Interim issues since last visit: here for physical  USPSTF grade A and B recommendations Depression:  Depression screen [facility] 2/9 05/23/2016 09/07/2015 06/14/2015 11/09/2014  Decreased Interest 0 0 0 0  Down, Depressed, Hopeless 0 0 1 0  PHQ - 2 Score 0 0 1 0  Altered sleeping 0 - - -  Tired, decreased energy 0 - - -  Change in appetite 0 - - -  Feeling bad or failure about yourself  0 - - -  Trouble concentrating 0 - - -  Moving slowly or fidgety/restless 0 - - -  Suicidal thoughts 0 - - -  PHQ-9 Score 0 - - -   Hypertension: BP Readings from Last 3 Encounters:  05/23/16 112/64  09/07/15 112/72  07/16/15 135/90   Obesity: fluctuates between 107 here; works out a lot; never starves herself [employer] from Last 3 Encounters:  05/23/16 109 lb 8 oz (49.7 kg)  09/07/15 115 lb (52.2 kg)  07/15/15 118 lb (53.5 kg)   BMI Readings from Last 3 Encounters:  05/23/16 20.69 kg/m  09/07/15 21.73 kg/m  07/15/15 22.30 kg/m    Alcohol: safe Tobacco use: no HIV, hep B, hep C: HIV negative 2017, recheck, new boyfriend STD testing and prevention (chl/gon/syphilis): GC/CHL negative 2017; recheck Intimate partner violence: no abuse Breast cancer: no lumps BRCA gene screening: no breast/ovarian cancer Cervical cancer screening: UTD; she cannot remember so would prefer to have today; Dr. Marchia Bond Lipids: check today, water and cheesedog earlier Lab Results  Component Value Date   CHOL 146 05/23/2016   Lab Results  Component Value Date   HDL 74 05/23/2016   Lab Results  Component Value Date   LDLCALC 47 05/23/2016   Lab Results  Component Value Date   TRIG 123 05/23/2016   Lab Results  Component Value Date   CHOLHDL 2.0 05/23/2016   No results found for: LDLDIRECT Glucose: check nonfasting  today Glucose, Bld  Date Value Ref Range Status  05/23/2016 95 65 - 99 mg/dL Final  07/15/2015 85 65 - 99 mg/dL Final   Colorectal cancer: no fam hx Diet: drinking lots of water; not much calcium Exercise: works out 5x a week Skin cancer: went to dermatologist, freckle on lip; checked out, benign  Recurrent UTIs; 2-3 a month Herpes infections, some menses related; once a month; takes medicine BID when she gets flares  Past Medical History:  Diagnosis Date  . ADHD (attention deficit hyperactivity disorder)    history of  . Chronic UTI (urinary tract infection)   . Herpes genitalia   . Insomnia due to stress    No past surgical history on file.   Family History  Problem Relation Age of Onset  . Drug abuse Father   . Cervical cancer Sister    Social History  Substance Use Topics  . Smoking status: Current Some Day Smoker    Types: E-cigarettes  . Smokeless tobacco: Never Used  . Alcohol use 0.0 oz/week   Review of Systems  Objective:   Vitals:   05/23/16 1406  BP: 112/64  Pulse: 97  Resp: 14  Temp: 98 F (36.7 C)  TempSrc: Oral  SpO2: 98%  Weight: 109 lb 8 oz (49.7 kg)   Body mass index is 20.69 kg/m. Wt  Readings from Last 3 Encounters:  05/23/16 109 lb 8 oz (49.7 kg)  09/07/15 115 lb (52.2 kg)  07/15/15 118 lb (53.5 kg)   Physical Exam  Constitutional: She appears well-developed and well-nourished.  HENT:  Head: Normocephalic and atraumatic.  Eyes: Conjunctivae and EOM are normal. Right eye exhibits no hordeolum. Left eye exhibits no hordeolum. No scleral icterus.  Neck: Carotid bruit is not present. No thyromegaly present.  Cardiovascular: Normal rate, regular rhythm, S1 normal, S2 normal and normal heart sounds.   No extrasystoles are present.  Pulmonary/Chest: Effort normal and breath sounds normal. No respiratory distress. Right breast exhibits no inverted nipple, no mass, no nipple discharge, no skin change and no tenderness. Left breast exhibits no  inverted nipple, no mass, no nipple discharge, no skin change and no tenderness. Breasts are symmetrical.  Abdominal: Soft. Normal appearance and bowel sounds are normal. She exhibits no distension, no abdominal bruit, no pulsatile midline mass and no mass. There is no hepatosplenomegaly. There is no tenderness. No hernia.  Genitourinary: Uterus normal. Pelvic exam was performed with patient prone. There is no rash or lesion on the right labia. There is no rash or lesion on the left labia. Cervix exhibits no motion tenderness and no discharge. Right adnexum displays no mass, no tenderness and no fullness. Left adnexum displays no mass, no tenderness and no fullness. Vaginal discharge (whitish discharge, no strong odor) found.  Genitourinary Comments: Cervix was difficult to find, deep (posterior) in the pelvis  Musculoskeletal: Normal range of motion. She exhibits no edema.  Lymphadenopathy:       Head (right side): No submandibular adenopathy present.       Head (left side): No submandibular adenopathy present.    She has no cervical adenopathy.    She has no axillary adenopathy.  Neurological: She is alert. She displays no tremor. No cranial nerve deficit. She exhibits normal muscle tone. Gait normal.  Skin: Skin is warm and dry. No bruising and no ecchymosis noted. No cyanosis. No pallor.  Psychiatric: Her speech is normal and behavior is normal. Thought content normal. Her mood appears not anxious. She does not exhibit a depressed mood.    Assessment/Plan:   Problem List Items Addressed This Visit      Genitourinary   Chronic UTI (urinary tract infection)    Start postcoital prophylaxis, void after intercourse      Relevant Medications   nitrofurantoin (MACRODANTIN) 50 MG capsule     Other   Screening for cervical cancer    Check today      Relevant Orders   Pap IG, CT/NG w/ reflex HPV when ASC-U   Screen for STD (sexually transmitted disease)    screen      Relevant Orders    RPR (Completed)   Hepatitis panel, acute (Completed)   HIV antibody (Completed)   Preventative health care    USPSTF grade A and B recommendations reviewed with patient; age-appropriate recommendations, preventive care, screening tests, etc discussed and encouraged; healthy living encouraged; see AVS for patient education given to patient       Relevant Orders   Lipid panel (Completed)   COMPLETE METABOLIC PANEL WITH GFR (Completed)   TSH (Completed)   Herpes simplex infection    Continue valtrex, add B complex vitamins       Other Visit Diagnoses    White vaginal discharge    -  Primary   Relevant Orders   WET PREP BY MOLECULAR PROBE (Completed)  Meds ordered this encounter  Medications  . nitrofurantoin (MACRODANTIN) 50 MG capsule    Sig: Take 1 capsule (50 mg total) by mouth daily as needed.    Dispense:  30 capsule    Refill:  5  . b complex vitamins tablet    Sig: Take 1 tablet by mouth daily.    Dispense:  30 tablet    Refill:  11   Orders Placed This Encounter  Procedures  . WET PREP BY MOLECULAR PROBE  . RPR  . Hepatitis panel, acute  . HIV antibody  . Lipid panel  . COMPLETE METABOLIC PANEL WITH GFR  . TSH    Follow up plan: Return in about 1 year (around 05/23/2017) for complete physical.  An After Visit Summary was printed and given to the patient.

## 2016-05-23 NOTE — Assessment & Plan Note (Signed)
USPSTF grade A and B recommendations reviewed with patient; age-appropriate recommendations, preventive care, screening tests, etc discussed and encouraged; healthy living encouraged; see AVS for patient education given to patient  

## 2016-05-23 NOTE — Assessment & Plan Note (Signed)
Continue valtrex, add B complex vitamins

## 2016-05-23 NOTE — Assessment & Plan Note (Signed)
Check today 

## 2016-05-23 NOTE — Assessment & Plan Note (Signed)
Start postcoital prophylaxis, void after intercourse

## 2016-05-23 NOTE — Patient Instructions (Addendum)
Try almond milk, increase calcium intake in leafy greens Start B complex vitamins  Health Maintenance, Female Adopting a healthy lifestyle and getting preventive care can go a long way to promote health and wellness. Talk with your health care provider about what schedule of regular examinations is right for you. This is a good chance for you to check in with your provider about disease prevention and staying healthy. In between checkups, there are plenty of things you can do on your own. Experts have done a lot of research about which lifestyle changes and preventive measures are most likely to keep you healthy. Ask your health care provider for more information. Weight and diet Eat a healthy diet  Be sure to include plenty of vegetables, fruits, low-fat dairy products, and lean protein.  Do not eat a lot of foods high in solid fats, added sugars, or salt.  Get regular exercise. This is one of the most important things you can do for your health.  Most adults should exercise for at least 150 minutes each week. The exercise should increase your heart rate and make you sweat (moderate-intensity exercise).  Most adults should also do strengthening exercises at least twice a week. This is in addition to the moderate-intensity exercise. Maintain a healthy weight  Body mass index (BMI) is a measurement that can be used to identify possible weight problems. It estimates body fat based on height and weight. Your health care provider can help determine your BMI and help you achieve or maintain a healthy weight.  For females 93 years of age and older:  A BMI below 18.5 is considered underweight.  A BMI of 18.5 to 24.9 is normal.  A BMI of 25 to 29.9 is considered overweight.  A BMI of 30 and above is considered obese. Watch levels of cholesterol and blood lipids  You should start having your blood tested for lipids and cholesterol at 22 years of age, then have this test every 5 years.  You  may need to have your cholesterol levels checked more often if:  Your lipid or cholesterol levels are high.  You are older than 22 years of age.  You are at high risk for heart disease. Cancer screening Lung Cancer  Lung cancer screening is recommended for adults 91-83 years old who are at high risk for lung cancer because of a history of smoking.  A yearly low-dose CT scan of the lungs is recommended for people who:  Currently smoke.  Have quit within the past 15 years.  Have at least a 30-pack-year history of smoking. A pack year is smoking an average of one pack of cigarettes a day for 1 year.  Yearly screening should continue until it has been 15 years since you quit.  Yearly screening should stop if you develop a health problem that would prevent you from having lung cancer treatment. Breast Cancer  Practice breast self-awareness. This means understanding how your breasts normally appear and feel.  It also means doing regular breast self-exams. Let your health care provider know about any changes, no matter how small.  If you are in your 20s or 30s, you should have a clinical breast exam (CBE) by a health care provider every 1-3 years as part of a regular health exam.  If you are 1 or older, have a CBE every year. Also consider having a breast X-ray (mammogram) every year.  If you have a family history of breast cancer, talk to your health care provider  about genetic screening.  If you are at high risk for breast cancer, talk to your health care provider about having an MRI and a mammogram every year.  Breast cancer gene (BRCA) assessment is recommended for women who have family members with BRCA-related cancers. BRCA-related cancers include:  Breast.  Ovarian.  Tubal.  Peritoneal cancers.  Results of the assessment will determine the need for genetic counseling and BRCA1 and BRCA2 testing. Cervical Cancer  Your health care provider may recommend that you be  screened regularly for cancer of the pelvic organs (ovaries, uterus, and vagina). This screening involves a pelvic examination, including checking for microscopic changes to the surface of your cervix (Pap test). You may be encouraged to have this screening done every 3 years, beginning at age 47.  For women ages 80-65, health care providers may recommend pelvic exams and Pap testing every 3 years, or they may recommend the Pap and pelvic exam, combined with testing for human papilloma virus (HPV), every 5 years. Some types of HPV increase your risk of cervical cancer. Testing for HPV may also be done on women of any age with unclear Pap test results.  Other health care providers may not recommend any screening for nonpregnant women who are considered low risk for pelvic cancer and who do not have symptoms. Ask your health care provider if a screening pelvic exam is right for you.  If you have had past treatment for cervical cancer or a condition that could lead to cancer, you need Pap tests and screening for cancer for at least 20 years after your treatment. If Pap tests have been discontinued, your risk factors (such as having a new sexual partner) need to be reassessed to determine if screening should resume. Some women have medical problems that increase the chance of getting cervical cancer. In these cases, your health care provider may recommend more frequent screening and Pap tests. Colorectal Cancer  This type of cancer can be detected and often prevented.  Routine colorectal cancer screening usually begins at 22 years of age and continues through 22 years of age.  Your health care provider may recommend screening at an earlier age if you have risk factors for colon cancer.  Your health care provider may also recommend using home test kits to check for hidden blood in the stool.  A small camera at the end of a tube can be used to examine your colon directly (sigmoidoscopy or colonoscopy).  This is done to check for the earliest forms of colorectal cancer.  Routine screening usually begins at age 45.  Direct examination of the colon should be repeated every 5-10 years through 22 years of age. However, you may need to be screened more often if early forms of precancerous polyps or small growths are found. Skin Cancer  Check your skin from head to toe regularly.  Tell your health care provider about any new moles or changes in moles, especially if there is a change in a mole's shape or color.  Also tell your health care provider if you have a mole that is larger than the size of a pencil eraser.  Always use sunscreen. Apply sunscreen liberally and repeatedly throughout the day.  Protect yourself by wearing long sleeves, pants, a wide-brimmed hat, and sunglasses whenever you are outside. Heart disease, diabetes, and high blood pressure  High blood pressure causes heart disease and increases the risk of stroke. High blood pressure is more likely to develop in:  People who have  blood pressure in the high end of the normal range (130-139/85-89 mm Hg).  People who are overweight or obese.  People who are African American.  If you are 60-34 years of age, have your blood pressure checked every 3-5 years. If you are 65 years of age or older, have your blood pressure checked every year. You should have your blood pressure measured twice-once when you are at a hospital or clinic, and once when you are not at a hospital or clinic. Record the average of the two measurements. To check your blood pressure when you are not at a hospital or clinic, you can use:  An automated blood pressure machine at a pharmacy.  A home blood pressure monitor.  If you are between 36 years and 44 years old, ask your health care provider if you should take aspirin to prevent strokes.  Have regular diabetes screenings. This involves taking a blood sample to check your fasting blood sugar level.  If you  are at a normal weight and have a low risk for diabetes, have this test once every three years after 22 years of age.  If you are overweight and have a high risk for diabetes, consider being tested at a younger age or more often. Preventing infection Hepatitis B  If you have a higher risk for hepatitis B, you should be screened for this virus. You are considered at high risk for hepatitis B if:  You were born in a country where hepatitis B is common. Ask your health care provider which countries are considered high risk.  Your parents were born in a high-risk country, and you have not been immunized against hepatitis B (hepatitis B vaccine).  You have HIV or AIDS.  You use needles to inject street drugs.  You live with someone who has hepatitis B.  You have had sex with someone who has hepatitis B.  You get hemodialysis treatment.  You take certain medicines for conditions, including cancer, organ transplantation, and autoimmune conditions. Hepatitis C  Blood testing is recommended for:  Everyone born from [birth year] through 1966.  Anyone with known risk factors for hepatitis C. Sexually transmitted infections (STIs)  You should be screened for sexually transmitted infections (STIs) including gonorrhea and chlamydia if:  You are sexually active and are younger than 22 years of age.  You are older than 22 years of age and your health care provider tells you that you are at risk for this type of infection.  Your sexual activity has changed since you were last screened and you are at an increased risk for chlamydia or gonorrhea. Ask your health care provider if you are at risk.  If you do not have HIV, but are at risk, it may be recommended that you take a prescription medicine daily to prevent HIV infection. This is called pre-exposure prophylaxis (PrEP). You are considered at risk if:  You are sexually active and do not regularly use condoms or know the HIV status of your  partner(s).  You take drugs by injection.  You are sexually active with a partner who has HIV. Talk with your health care provider about whether you are at high risk of being infected with HIV. If you choose to begin PrEP, you should first be tested for HIV. You should then be tested every 3 months for as long as you are taking PrEP. Pregnancy  If you are premenopausal and you may become pregnant, ask your health care provider about preconception counseling.  If you may become pregnant, take 400 to 800 micrograms (mcg) of folic acid every day.  If you want to prevent pregnancy, talk to your health care provider about birth control (contraception). Osteoporosis and menopause  Osteoporosis is a disease in which the bones lose minerals and strength with aging. This can result in serious bone fractures. Your risk for osteoporosis can be identified using a bone density scan.  If you are 57 years of age or older, or if you are at risk for osteoporosis and fractures, ask your health care provider if you should be screened.  Ask your health care provider whether you should take a calcium or vitamin D supplement to lower your risk for osteoporosis.  Menopause may have certain physical symptoms and risks.  Hormone replacement therapy may reduce some of these symptoms and risks. Talk to your health care provider about whether hormone replacement therapy is right for you. Follow these instructions at home:  Schedule regular health, dental, and eye exams.  Stay current with your immunizations.  Do not use any tobacco products including cigarettes, chewing tobacco, or electronic cigarettes.  If you are pregnant, do not drink alcohol.  If you are breastfeeding, limit how much and how often you drink alcohol.  Limit alcohol intake to no more than 1 drink per day for nonpregnant women. One drink equals 12 ounces of beer, 5 ounces of wine, or 1 ounces of hard liquor.  Do not use street  drugs.  Do not share needles.  Ask your health care provider for help if you need support or information about quitting drugs.  Tell your health care provider if you often feel depressed.  Tell your health care provider if you have ever been abused or do not feel safe at home. This information is not intended to replace advice given to you by your health care provider. Make sure you discuss any questions you have with your health care provider. Document Released: 07/11/2010 Document Revised: 06/03/2015 Document Reviewed: 09/29/2014 Elsevier Interactive Patient Education  2017 [employer].

## 2016-05-24 ENCOUNTER — Other Ambulatory Visit: Payer: Self-pay | Admitting: Family Medicine

## 2016-05-24 ENCOUNTER — Telehealth: Payer: Self-pay

## 2016-05-24 LAB — WET PREP BY MOLECULAR PROBE
CANDIDA SPECIES: NOT DETECTED
Gardnerella vaginalis: DETECTED — AB
TRICHOMONAS VAG: NOT DETECTED

## 2016-05-24 LAB — TSH: TSH: 0.65 m[IU]/L

## 2016-05-24 LAB — HEPATITIS PANEL, ACUTE
HCV Ab: NEGATIVE
HEP B S AG: NEGATIVE
Hep A IgM: NONREACTIVE
Hep B C IgM: NONREACTIVE

## 2016-05-24 LAB — RPR

## 2016-05-24 LAB — HIV ANTIBODY (ROUTINE TESTING W REFLEX): HIV: NONREACTIVE

## 2016-05-24 MED ORDER — METRONIDAZOLE 500 MG PO TABS: 500.0000 mg | ORAL_TABLET | Freq: Two times a day (BID) | ORAL | 0 refills | Status: AC

## 2016-05-24 NOTE — Telephone Encounter (Signed)
Reach out to the pt regarding her lab results. Pt voicemail is full, not able to leave a voicemail.

## 2016-05-24 NOTE — Telephone Encounter (Signed)
-----   Message from Kerman PasseyMelinda P Lada, MD sent at 05/24/2016 11:35 AM EDT ----- Please let pt know that her discharge is the result of bacterial vagnosis, which is nothing to worry about, completely treatable, and her partner does not need any treatment; it's like a yeast infection, just a different organism; I'll send in Rx; her other labs looked great; her cholesterol is fantastic; thank you

## 2016-05-24 NOTE — Progress Notes (Signed)
Metronidazole Rx 

## 2016-05-25 LAB — PAP IG, CT-NG, RFX HPV ASCU
CHLAMYDIA PROBE AMP: NOT DETECTED
GC PROBE AMP: NOT DETECTED

## 2016-08-03 ENCOUNTER — Other Ambulatory Visit: Payer: Self-pay | Admitting: Family Medicine

## 2016-08-04 ENCOUNTER — Telehealth: Payer: Self-pay

## 2016-08-04 NOTE — Telephone Encounter (Signed)
Tried reaching out to pt regarding positive lab results that need to be treated. Was not able to get in contact with the pt for the second time. I will send out a letter and a copy of her lab results to make sure she is aware of her results.

## 2016-08-08 ENCOUNTER — Encounter: Payer: Self-pay | Admitting: Family Medicine

## 2016-08-08 ENCOUNTER — Ambulatory Visit (INDEPENDENT_AMBULATORY_CARE_PROVIDER_SITE_OTHER): Payer: BLUE CROSS/BLUE SHIELD | Admitting: Family Medicine

## 2016-08-08 VITALS — BP 112/72 | HR 97 | Temp 98.2°F | Resp 14 | Wt 106.3 lb

## 2016-08-08 DIAGNOSIS — L678 Other hair color and hair shaft abnormalities: Secondary | ICD-10-CM

## 2016-08-08 DIAGNOSIS — B009 Herpesviral infection, unspecified: Secondary | ICD-10-CM

## 2016-08-08 DIAGNOSIS — R209 Unspecified disturbances of skin sensation: Secondary | ICD-10-CM | POA: Diagnosis not present

## 2016-08-08 DIAGNOSIS — R5383 Other fatigue: Secondary | ICD-10-CM

## 2016-08-08 DIAGNOSIS — Z789 Other specified health status: Secondary | ICD-10-CM | POA: Insufficient documentation

## 2016-08-08 DIAGNOSIS — K5900 Constipation, unspecified: Secondary | ICD-10-CM | POA: Diagnosis not present

## 2016-08-08 DIAGNOSIS — N920 Excessive and frequent menstruation with regular cycle: Secondary | ICD-10-CM

## 2016-08-08 LAB — CBC WITH DIFFERENTIAL/PLATELET
BASOS ABS: 0 {cells}/uL (ref 0–200)
Basophils Relative: 0 %
EOS PCT: 2 %
Eosinophils Absolute: 122 cells/uL (ref 15–500)
HCT: 43.5 % (ref 35.0–45.0)
Hemoglobin: 15.2 g/dL (ref 11.7–15.5)
Lymphocytes Relative: 35 %
Lymphs Abs: 2135 cells/uL (ref 850–3900)
MCH: 31.9 pg (ref 27.0–33.0)
MCHC: 34.9 g/dL (ref 32.0–36.0)
MCV: 91.2 fL (ref 80.0–100.0)
MONOS PCT: 7 %
MPV: 10.2 fL (ref 7.5–12.5)
Monocytes Absolute: 427 cells/uL (ref 200–950)
NEUTROS PCT: 56 %
Neutro Abs: 3416 cells/uL (ref 1500–7800)
Platelets: 239 10*3/uL (ref 140–400)
RBC: 4.77 MIL/uL (ref 3.80–5.10)
RDW: 12.9 % (ref 11.0–15.0)
WBC: 6.1 10*3/uL (ref 3.8–10.8)

## 2016-08-08 LAB — T4, FREE: FREE T4: 1.3 ng/dL (ref 0.8–1.8)

## 2016-08-08 LAB — TSH: TSH: 1.17 m[IU]/L

## 2016-08-08 MED ORDER — VALACYCLOVIR HCL 1 G PO TABS: 1000.0000 mg | ORAL_TABLET | Freq: Every day | ORAL | 11 refills | Status: DC

## 2016-08-08 NOTE — Assessment & Plan Note (Signed)
Cautioned patient about the risks of bronchiolitis obliterans, association of disease with diacetyl in flavored e-cigarettes with irreversible lung damage; hand-out given from the American [employer] Association about this topic, which includes the names of two other chemicals to avoid

## 2016-08-08 NOTE — Patient Instructions (Signed)
Let's get labs today Adjust medicine as we discussed Keep me posted about any new symptoms

## 2016-08-08 NOTE — Assessment & Plan Note (Signed)
Increase valtrex to 1 gram daily, 1 gram BID x 3 days for outbreaks

## 2016-08-08 NOTE — Progress Notes (Signed)
BP 112/72   Pulse 97   Temp 98.2 F (36.8 C) (Oral)   Resp 14   Wt 106 lb 4.8 oz (48.2 kg)   LMP 07/22/2016   SpO2 97%   BMI 20.09 kg/m    Subjective:    Patient ID: Allison Oliver, female    DOB: 03/08/1995, 21 y.o.   MRN: [id number]  HPI: Allison Oliver is a 21 y.o. female  Chief Complaint  Patient presents with  . Anemia    has symptoms of anemia: dry hair, heavy periods, constipation and cold hands and fingers  . genital herpes    alot of outbreaks   HPI Patient is here for a few things; she believes that she may have anemia She has been bothered with heavy menstrual periods, cold hands/fingers, but has also had dry hair and constipation No bald patches and no hair loss except on head Very irritable, just crying for no reason Very tired; some days just wants to lie in the bed No fam hx of thyroid disease BMs 3-4 x a week; used to go 1-2x a day Periods usually heavy at the beginning and light at the end; this last time, bled heavy every day; heavy clots dropping in shower Light-headed and SHOB sometimes with exertions Losing weight unintentionally; 115 down to 106 pounds over two months Headaches; no N/V  Bloated and gassy after eating  She has genital herpes Getting twice a month; using valtrex 500 mg daily and B complex vitamin; getting enough sleep  Last bladder infection maybe 3 months ago; working well  Vaping, e-cigarettes  [facility] 08/08/2016 05/23/2016 09/07/2015 06/14/2015 11/09/2014  Decreased Interest 0 0 0 0 0  Down, Depressed, Hopeless 0 0 0 1 0  PHQ - 2 Score 0 0 0 1 0  Altered sleeping - 0 - - -  Tired, decreased energy - 0 - - -  Change in appetite - 0 - - -  Feeling bad or failure about yourself  - 0 - - -  Trouble concentrating - 0 - - -  Moving slowly or fidgety/restless - 0 - - -  Suicidal thoughts - 0 - - -  PHQ-9 Score - 0 - - -    Relevant past medical, surgical, family and social history reviewed Past Medical  History:  Diagnosis Date  . ADHD (attention deficit hyperactivity disorder)    history of  . Chronic UTI (urinary tract infection)   . Herpes genitalia   . Insomnia due to stress    History reviewed. No pertinent surgical history. Family History  Problem Relation Age of Onset  . Drug abuse Father   . Cervical cancer Sister        HPV?  Marland Kitchen. Cancer Maternal Grandfather        lung cancer  . Heart attack Paternal Grandfather    Social History   Social History  . Marital status: Single    Spouse name: N/A  . Number of children: N/A  . Years of education: N/A   Occupational History  . Not on file.   Social History Main Topics  . Smoking status: Current Some Day Smoker    Types: E-cigarettes  . Smokeless tobacco: Never Used  . Alcohol use 0.0 oz/week     Comment: occasional  . Drug use: Yes    Types: Marijuana  . Sexual activity: Yes    Partners: Male    Birth control/ protection: Condom   Other Topics  Concern  . Not on file   Social History Narrative  . No narrative on file    Interim medical history since last visit reviewed. Allergies and medications reviewed  Review of Systems Per HPI unless specifically indicated above     Objective:    BP 112/72   Pulse 97   Temp 98.2 F (36.8 C) (Oral)   Resp 14   Wt 106 lb 4.8 oz (48.2 kg)   LMP 07/22/2016   SpO2 97%   BMI 20.09 kg/m   Wt Readings from Last 3 Encounters:  08/08/16 106 lb 4.8 oz (48.2 kg)  05/23/16 109 lb 8 oz (49.7 kg)  09/07/15 115 lb (52.2 kg)    Physical Exam  Constitutional: She appears well-developed and well-nourished.  Weight loss noted  HENT:  Head: Normocephalic and atraumatic.  No significant hair breakage; no visible alopecia  Eyes: Conjunctivae and EOM are normal. Right eye exhibits no hordeolum. Left eye exhibits no hordeolum. No scleral icterus.  Neck: No JVD present. Carotid bruit is not present. No tracheal deviation present. No thyroid mass and no thyromegaly present.    Mildly tender to palpation over the anterior neck, thyroid gland  Cardiovascular: Normal rate, regular rhythm, S1 normal, S2 normal and normal heart sounds.   No extrasystoles are present.  Toes and distal feet are very cool to the touch and somewhat violaceous; no ischemia; no ulcers  Pulmonary/Chest: Effort normal and breath sounds normal. No respiratory distress. Right breast exhibits no inverted nipple, no mass, no nipple discharge, no skin change and no tenderness. Left breast exhibits no inverted nipple, no mass, no nipple discharge, no skin change and no tenderness. Breasts are symmetrical.  Abdominal: Soft. Normal appearance and bowel sounds are normal. She exhibits no distension, no abdominal bruit, no pulsatile midline mass and no mass. There is no hepatosplenomegaly. There is no tenderness. No hernia.  Genitourinary:  Genitourinary Comments: Cervix was difficult to find, deep (posterior) in the pelvis  Musculoskeletal: Normal range of motion. She exhibits no edema.  Lymphadenopathy:       Head (right side): No submandibular adenopathy present.       Head (left side): No submandibular adenopathy present.    She has no cervical adenopathy.    She has no axillary adenopathy.  Neurological: She is alert. She displays no tremor.  Reflex Scores:      Patellar reflexes are 3+ on the right side and 3+ on the left side. Very brisk reflexes  Skin: Skin is warm and dry. No bruising and no ecchymosis noted. No cyanosis. No pallor.  Psychiatric: Her speech is normal and behavior is normal. Thought content normal. Her mood appears not anxious. She does not exhibit a depressed mood.       Assessment & Plan:   Problem List Items Addressed This Visit      Other   Herpes simplex infection    Increase valtrex to 1 gram daily, 1 gram BID x 3 days for outbreaks      Relevant Medications   valACYclovir (VALTREX) 1000 MG tablet   Electronic cigarette use    Cautioned patient about the risks  of bronchiolitis obliterans, association of disease with diacetyl in flavored e-cigarettes with irreversible lung damage; hand-out given from the American [employer] Association about this topic, which includes the names of two other chemicals to avoid       Other Visit Diagnoses    Other fatigue    -  Primary   ddx  includes anemia, thyroid disease, auto-immune disorder, etc; will start with labs; pt to call with any change in symptoms   Relevant Orders   CBC with Differential/Platelet   COMPLETE METABOLIC PANEL WITH GFR   TSH   Thyroid Peroxidase Antibody   T4, free   Dry hair       check thyroid labs   Menorrhagia with regular cycle       check thyroid labs, cbc   Cold hands and feet       check cbc, thyroid, autoimmune labs; raynaud's in ddx; could start trial of CCB if normal labs; socks to bed   Relevant Orders   ANA   Constipation, unspecified constipation type       check thyroid labs   Relevant Orders   CBC with Differential/Platelet   TSH   Thyroid Peroxidase Antibody   T4, free       Follow up plan: No Follow-up on file.  An after-visit summary was printed and given to the patient at check-out.  Please see the patient instructions which may contain other information and recommendations beyond what is mentioned above in the assessment and plan.  Meds ordered this encounter  Medications  . valACYclovir (VALTREX) 1000 MG tablet    Sig: Take 1 tablet (1,000 mg total) by mouth daily. For suppression; one tablet twice a day for 3 days for outbreaks    Dispense:  36 tablet    Refill:  11    Call me with any concerns    Orders Placed This Encounter  Procedures  . CBC with Differential/Platelet  . COMPLETE METABOLIC PANEL WITH GFR  . TSH  . Thyroid Peroxidase Antibody  . T4, free  . ANA

## 2016-08-09 LAB — COMPLETE METABOLIC PANEL WITH GFR
ALT: 8 U/L (ref 6–29)
AST: 12 U/L (ref 10–30)
Albumin: 4.8 g/dL (ref 3.6–5.1)
Alkaline Phosphatase: 47 U/L (ref 33–115)
BILIRUBIN TOTAL: 0.4 mg/dL (ref 0.2–1.2)
BUN: 14 mg/dL (ref 7–25)
CHLORIDE: 104 mmol/L (ref 98–110)
CO2: 22 mmol/L (ref 20–31)
CREATININE: 0.74 mg/dL (ref 0.50–1.10)
Calcium: 9.7 mg/dL (ref 8.6–10.2)
Glucose, Bld: 75 mg/dL (ref 65–99)
Potassium: 4.2 mmol/L (ref 3.5–5.3)
Sodium: 140 mmol/L (ref 135–146)
TOTAL PROTEIN: 7.5 g/dL (ref 6.1–8.1)

## 2016-08-09 LAB — ANTI-NUCLEAR AB-TITER (ANA TITER): ANA Titer 1: 1:40 {titer} — ABNORMAL HIGH

## 2016-08-09 LAB — ANA: Anti Nuclear Antibody(ANA): POSITIVE — AB

## 2016-08-10 LAB — THYROID PEROXIDASE ANTIBODY

## 2016-08-14 ENCOUNTER — Telehealth: Payer: Self-pay

## 2016-08-14 NOTE — Telephone Encounter (Signed)
Please review rest of labs

## 2016-08-15 ENCOUNTER — Other Ambulatory Visit: Payer: Self-pay

## 2016-08-15 DIAGNOSIS — R768 Other specified abnormal immunological findings in serum: Secondary | ICD-10-CM

## 2016-08-17 ENCOUNTER — Telehealth: Payer: Self-pay | Admitting: Family Medicine

## 2016-08-17 NOTE — Telephone Encounter (Signed)
Went over pt labs and mailed her a copy of the results.

## 2016-08-17 NOTE — Telephone Encounter (Signed)
Pt states that someone called her and told her her test results, however she was at work and could not really understand. She is asking for a return call for the results again [phone]

## 2017-02-25 NOTE — Progress Notes (Signed)
Closing out scanned document note open since  2017 Wet signed at the time

## 2017-05-25 ENCOUNTER — Encounter: Payer: BLUE CROSS/BLUE SHIELD | Admitting: Family Medicine

## 2017-07-11 ENCOUNTER — Encounter: Payer: Self-pay | Admitting: Family Medicine

## 2017-07-11 ENCOUNTER — Ambulatory Visit: Payer: BLUE CROSS/BLUE SHIELD | Admitting: Family Medicine

## 2017-07-11 VITALS — BP 116/78 | HR 99 | Temp 97.9°F | Resp 16 | Ht 63.0 in | Wt 101.4 lb

## 2017-07-11 DIAGNOSIS — R1310 Dysphagia, unspecified: Secondary | ICD-10-CM | POA: Diagnosis not present

## 2017-07-11 DIAGNOSIS — Z114 Encounter for screening for human immunodeficiency virus [HIV]: Secondary | ICD-10-CM | POA: Diagnosis not present

## 2017-07-11 DIAGNOSIS — Z113 Encounter for screening for infections with a predominantly sexual mode of transmission: Secondary | ICD-10-CM | POA: Diagnosis not present

## 2017-07-11 DIAGNOSIS — Z124 Encounter for screening for malignant neoplasm of cervix: Secondary | ICD-10-CM | POA: Diagnosis not present

## 2017-07-11 DIAGNOSIS — Z0000 Encounter for general adult medical examination without abnormal findings: Secondary | ICD-10-CM

## 2017-07-11 DIAGNOSIS — R197 Diarrhea, unspecified: Secondary | ICD-10-CM | POA: Diagnosis not present

## 2017-07-11 DIAGNOSIS — R636 Underweight: Secondary | ICD-10-CM | POA: Diagnosis not present

## 2017-07-11 DIAGNOSIS — Z23 Encounter for immunization: Secondary | ICD-10-CM

## 2017-07-11 DIAGNOSIS — R1319 Other dysphagia: Secondary | ICD-10-CM

## 2017-07-11 NOTE — Patient Instructions (Addendum)
We'll get labs today We'll have you see the gastroenterologist If you have not heard anything from my staff in a week about any orders/referrals/studies from today, please contact us here to follow-up [phone]  Health Maintenance, Female Adopting a healthy lifestyle and getting preventive care can go a long way to promote health and wellness. Talk with your health care provider about what schedule of regular examinations is right for you. This is a good chance for you to check in with your provider about disease prevention and staying healthy. In between checkups, there are plenty of things you can do on your own. Experts have done a lot of research about which lifestyle changes and preventive measures are most likely to keep you healthy. Ask your health care provider for more information. Weight and diet Eat a healthy diet  Be sure to include plenty of vegetables, fruits, low-fat dairy products, and lean protein.  Do not eat a lot of foods high in solid fats, added sugars, or salt.  Get regular exercise. This is one of the most important things you can do for your health. ? Most adults should exercise for at least 150 minutes each week. The exercise should increase your heart rate and make you sweat (moderate-intensity exercise). ? Most adults should also do strengthening exercises at least twice a week. This is in addition to the moderate-intensity exercise.  Maintain a healthy weight  Body mass index (BMI) is a measurement that can be used to identify possible weight problems. It estimates body fat based on height and weight. Your health care provider can help determine your BMI and help you achieve or maintain a healthy weight.  For females 57 years of age and older: ? A BMI below 18.5 is considered underweight. ? A BMI of 18.5 to 24.9 is normal. ? A BMI of 25 to 29.9 is considered overweight. ? A BMI of 30 and above is considered obese.  Watch levels of cholesterol and blood  lipids  You should start having your blood tested for lipids and cholesterol at 23 years of age, then have this test every 5 years.  You may need to have your cholesterol levels checked more often if: ? Your lipid or cholesterol levels are high. ? You are older than 23 years of age. ? You are at high risk for heart disease.  Cancer screening Lung Cancer  Lung cancer screening is recommended for adults 3-68 years old who are at high risk for lung cancer because of a history of smoking.  A yearly low-dose CT scan of the lungs is recommended for people who: ? Currently smoke. ? Have quit within the past 15 years. ? Have at least a 30-pack-year history of smoking. A pack year is smoking an average of one pack of cigarettes a day for 1 year.  Yearly screening should continue until it has been 15 years since you quit.  Yearly screening should stop if you develop a health problem that would prevent you from having lung cancer treatment.  Breast Cancer  Practice breast self-awareness. This means understanding how your breasts normally appear and feel.  It also means doing regular breast self-exams. Let your health care provider know about any changes, no matter how small.  If you are in your 20s or 30s, you should have a clinical breast exam (CBE) by a health care provider every 1-3 years as part of a regular health exam.  If you are 49 or older, have a CBE every  year. Also consider having a breast X-ray (mammogram) every year.  If you have a family history of breast cancer, talk to your health care provider about genetic screening.  If you are at high risk for breast cancer, talk to your health care provider about having an MRI and a mammogram every year.  Breast cancer gene (BRCA) assessment is recommended for women who have family members with BRCA-related cancers. BRCA-related cancers include: ? Breast. ? Ovarian. ? Tubal. ? Peritoneal cancers.  Results of the assessment will  determine the need for genetic counseling and BRCA1 and BRCA2 testing.  Cervical Cancer Your health care provider may recommend that you be screened regularly for cancer of the pelvic organs (ovaries, uterus, and vagina). This screening involves a pelvic examination, including checking for microscopic changes to the surface of your cervix (Pap test). You may be encouraged to have this screening done every 3 years, beginning at age 21.  For women ages 30-65, health care providers may recommend pelvic exams and Pap testing every 3 years, or they may recommend the Pap and pelvic exam, combined with testing for human papilloma virus (HPV), every 5 years. Some types of HPV increase your risk of cervical cancer. Testing for HPV may also be done on women of any age with unclear Pap test results.  Other health care providers may not recommend any screening for nonpregnant women who are considered low risk for pelvic cancer and who do not have symptoms. Ask your health care provider if a screening pelvic exam is right for you.  If you have had past treatment for cervical cancer or a condition that could lead to cancer, you need Pap tests and screening for cancer for at least 20 years after your treatment. If Pap tests have been discontinued, your risk factors (such as having a new sexual partner) need to be reassessed to determine if screening should resume. Some women have medical problems that increase the chance of getting cervical cancer. In these cases, your health care provider may recommend more frequent screening and Pap tests.  Colorectal Cancer  This type of cancer can be detected and often prevented.  Routine colorectal cancer screening usually begins at 23 years of age and continues through 23 years of age.  Your health care provider may recommend screening at an earlier age if you have risk factors for colon cancer.  Your health care provider may also recommend using home test kits to check  for hidden blood in the stool.  A small camera at the end of a tube can be used to examine your colon directly (sigmoidoscopy or colonoscopy). This is done to check for the earliest forms of colorectal cancer.  Routine screening usually begins at age 50.  Direct examination of the colon should be repeated every 5-10 years through 23 years of age. However, you may need to be screened more often if early forms of precancerous polyps or small growths are found.  Skin Cancer  Check your skin from head to toe regularly.  Tell your health care provider about any new moles or changes in moles, especially if there is a change in a mole's shape or color.  Also tell your health care provider if you have a mole that is larger than the size of a pencil eraser.  Always use sunscreen. Apply sunscreen liberally and repeatedly throughout the day.  Protect yourself by wearing long sleeves, pants, a wide-brimmed hat, and sunglasses whenever you are outside.  Heart disease, diabetes, and   high blood pressure  High blood pressure causes heart disease and increases the risk of stroke. High blood pressure is more likely to develop in: ? People who have blood pressure in the high end of the normal range (130-139/85-89 mm Hg). ? People who are overweight or obese. ? People who are African American.  If you are 37-39 years of age, have your blood pressure checked every 3-5 years. If you are 76 years of age or older, have your blood pressure checked every year. You should have your blood pressure measured twice-once when you are at a hospital or clinic, and once when you are not at a hospital or clinic. Record the average of the two measurements. To check your blood pressure when you are not at a hospital or clinic, you can use: ? An automated blood pressure machine at a pharmacy. ? A home blood pressure monitor.  If you are between 73 years and 63 years old, ask your health care provider if you should take  aspirin to prevent strokes.  Have regular diabetes screenings. This involves taking a blood sample to check your fasting blood sugar level. ? If you are at a normal weight and have a low risk for diabetes, have this test once every three years after 23 years of age. ? If you are overweight and have a high risk for diabetes, consider being tested at a younger age or more often. Preventing infection Hepatitis B  If you have a higher risk for hepatitis B, you should be screened for this virus. You are considered at high risk for hepatitis B if: ? You were born in a country where hepatitis B is common. Ask your health care provider which countries are considered high risk. ? Your parents were born in a high-risk country, and you have not been immunized against hepatitis B (hepatitis B vaccine). ? You have HIV or AIDS. ? You use needles to inject street drugs. ? You live with someone who has hepatitis B. ? You have had sex with someone who has hepatitis B. ? You get hemodialysis treatment. ? You take certain medicines for conditions, including cancer, organ transplantation, and autoimmune conditions.  Hepatitis C  Blood testing is recommended for: ? Everyone born from [birth year] through 1966. ? Anyone with known risk factors for hepatitis C.  Sexually transmitted infections (STIs)  You should be screened for sexually transmitted infections (STIs) including gonorrhea and chlamydia if: ? You are sexually active and are younger than 23 years of age. ? You are older than 23 years of age and your health care provider tells you that you are at risk for this type of infection. ? Your sexual activity has changed since you were last screened and you are at an increased risk for chlamydia or gonorrhea. Ask your health care provider if you are at risk.  If you do not have HIV, but are at risk, it may be recommended that you take a prescription medicine daily to prevent HIV infection. This is called  pre-exposure prophylaxis (PrEP). You are considered at risk if: ? You are sexually active and do not regularly use condoms or know the HIV status of your partner(s). ? You take drugs by injection. ? You are sexually active with a partner who has HIV.  Talk with your health care provider about whether you are at high risk of being infected with HIV. If you choose to begin PrEP, you should first be tested for HIV. You should then be  tested every 3 months for as long as you are taking PrEP. Pregnancy  If you are premenopausal and you may become pregnant, ask your health care provider about preconception counseling.  If you may become pregnant, take 400 to 800 micrograms (mcg) of folic acid every day.  If you want to prevent pregnancy, talk to your health care provider about birth control (contraception). Osteoporosis and menopause  Osteoporosis is a disease in which the bones lose minerals and strength with aging. This can result in serious bone fractures. Your risk for osteoporosis can be identified using a bone density scan.  If you are 65 years of age or older, or if you are at risk for osteoporosis and fractures, ask your health care provider if you should be screened.  Ask your health care provider whether you should take a calcium or vitamin D supplement to lower your risk for osteoporosis.  Menopause may have certain physical symptoms and risks.  Hormone replacement therapy may reduce some of these symptoms and risks. Talk to your health care provider about whether hormone replacement therapy is right for you. Follow these instructions at home:  Schedule regular health, dental, and eye exams.  Stay current with your immunizations.  Do not use any tobacco products including cigarettes, chewing tobacco, or electronic cigarettes.  If you are pregnant, do not drink alcohol.  If you are breastfeeding, limit how much and how often you drink alcohol.  Limit alcohol intake to no more  than 1 drink per day for nonpregnant women. One drink equals 12 ounces of beer, 5 ounces of wine, or 1 ounces of hard liquor.  Do not use street drugs.  Do not share needles.  Ask your health care provider for help if you need support or information about quitting drugs.  Tell your health care provider if you often feel depressed.  Tell your health care provider if you have ever been abused or do not feel safe at home. This information is not intended to replace advice given to you by your health care provider. Make sure you discuss any questions you have with your health care provider. Document Released: 07/11/2010 Document Revised: 06/03/2015 Document Reviewed: 09/29/2014 Elsevier Interactive Patient Education  2018 [employer]  

## 2017-07-11 NOTE — Assessment & Plan Note (Signed)
UTD

## 2017-07-11 NOTE — Assessment & Plan Note (Signed)
check

## 2017-07-11 NOTE — Assessment & Plan Note (Signed)
Check labs 

## 2017-07-11 NOTE — Assessment & Plan Note (Signed)
USPSTF grade A and B recommendations reviewed with patient; age-appropriate recommendations, preventive care, screening tests, etc discussed and encouraged; healthy living encouraged; see AVS for patient education given to patient  

## 2017-07-11 NOTE — Progress Notes (Addendum)
Patient ID: Allison Oliver, female   DOB: [birth year], 22 y.o.   MRN: [id number]   Subjective:   Allison Oliver is a 22 y.o. female here for a complete physical exam  Interim issues since last visit: she feels like something is sticking or sitting, restricted, but not like she is choking; upper chest in the middle; not eating as much; her good weight is 115 and she is fluctuating now from 103 to 98 pounds; stools are abnormal; greenish and loose all the time; going on for four months; using the bathroom way more than before, (stooling); urinating okay; appetite is kind of fair; some days eats twice a day, but yesterday did not eat until 10 pm; she loves to eat, but just thinks about food and just feels sick; she might get the food and then decide she doesn't want it; no blood in the stool; does get some pain in the RLQ and will sometimes move to the LLQ; no fam hx of inflammatory bowel disease  USPSTF grade A and B recommendations Depression:  Depression screen [facility] 2/9 07/11/2017 08/08/2016 05/23/2016 09/07/2015 06/14/2015  Decreased Interest 0 0 0 0 0  Down, Depressed, Hopeless 0 0 0 0 1  PHQ - 2 Score 0 0 0 0 1  Altered sleeping 0 - 0 - -  Tired, decreased energy 0 - 0 - -  Change in appetite 3 - 0 - -  Feeling bad or failure about yourself  0 - 0 - -  Trouble concentrating 0 - 0 - -  Moving slowly or fidgety/restless 0 - 0 - -  Suicidal thoughts 0 - 0 - -  PHQ-9 Score 3 - 0 - -   Hypertension: BP Readings from Last 3 Encounters:  07/11/17 116/78  08/08/16 112/72  05/23/16 112/64   Obesity: Wt Readings from Last 3 Encounters:  07/11/17 101 lb 6.4 oz (46 kg)  08/08/16 106 lb 4.8 oz (48.2 kg)  05/23/16 109 lb 8 oz (49.7 kg)   BMI Readings from Last 3 Encounters:  07/11/17 17.96 kg/m  08/08/16 20.09 kg/m  05/23/16 20.69 kg/m    Skin cancer: saw dermatologist in December; freckle on the lip and watching that Lung cancer:  No sx Breast cancer: no lumps or bumps; patient  declined breast exam by MD Colorectal cancer: no fam hx Cervical cancer screening: UTD BRCA gene screening: family hx of breast and/or ovarian cancer and/or metastatic prostate cancer? no HIV, hep B, hep C: test today STD testing and prevention (chl/gon/syphilis): test today Intimate partner violence: no abuse Contraception: condoms Osteoporosis: n/a Fall prevention/vitamin D: discussed Immunizations: tetanus today Diet: eats whatever Exercise: gym 4x week Alcohol: less than 7 Tobacco use: no AAA: n/a Aspirin:n/a Glucose:  Glucose, Bld  Date Value Ref Range Status  08/08/2016 75 65 - 99 mg/dL Final  05/23/2016 95 65 - 99 mg/dL Final  07/15/2015 85 65 - 99 mg/dL Final   Lipids:  Lab Results  Component Value Date   CHOL 146 05/23/2016   Lab Results  Component Value Date   HDL 74 05/23/2016   Lab Results  Component Value Date   LDLCALC 47 05/23/2016   Lab Results  Component Value Date   TRIG 123 05/23/2016   Lab Results  Component Value Date   CHOLHDL 2.0 05/23/2016   No results found for: LDLDIRECT   Past Medical History:  Diagnosis Date  . ADHD (attention deficit hyperactivity disorder)    history of  . Chronic UTI (  urinary tract infection)   . Herpes genitalia   . Insomnia due to stress    History reviewed. No pertinent surgical history. Family History  Problem Relation Age of Onset  . Drug abuse Father   . Cervical cancer Sister        HPV?  Marland Kitchen Cancer Maternal Grandfather        lung cancer  . Heart attack Paternal Grandfather    Social History   Tobacco Use  . Smoking status: Current Some Day Smoker    Types: E-cigarettes  . Smokeless tobacco: Never Used  Substance Use Topics  . Alcohol use: Yes    Alcohol/week: 0.0 oz    Comment: occasional  . Drug use: Yes    Types: Marijuana   Review of Systems  Genitourinary: Negative for dysuria and hematuria.    Objective:   Vitals:   07/11/17 1123  BP: 116/78  Pulse: 99  Resp: 16  Temp:  97.9 F (36.6 C)  TempSrc: Oral  SpO2: 99%  Weight: 101 lb 6.4 oz (46 kg)  Height: '5\' 3"'$  (1.6 m)   Body mass index is 17.96 kg/m. Wt Readings from Last 3 Encounters:  07/11/17 101 lb 6.4 oz (46 kg)  08/08/16 106 lb 4.8 oz (48.2 kg)  05/23/16 109 lb 8 oz (49.7 kg)   Physical Exam  Constitutional: She appears well-developed and well-nourished.  Small framed, slight build; weight loss note  HENT:  Head: Normocephalic and atraumatic.  Right Ear: Hearing, tympanic membrane, external ear and ear canal normal.  Left Ear: Hearing, tympanic membrane, external ear and ear canal normal.  Eyes: Conjunctivae and EOM are normal. Right eye exhibits no hordeolum. Left eye exhibits no hordeolum. No scleral icterus.  Neck: Carotid bruit is not present. No thyromegaly present.  Cardiovascular: Normal rate, regular rhythm, S1 normal, S2 normal and normal heart sounds.  No extrasystoles are present.  Pulmonary/Chest: Effort normal and breath sounds normal. No respiratory distress.  Abdominal: Soft. Normal appearance and bowel sounds are normal. She exhibits no distension, no abdominal bruit, no pulsatile midline mass and no mass. There is no hepatosplenomegaly. There is no tenderness. No hernia.  Musculoskeletal: Normal range of motion. She exhibits no edema.  Lymphadenopathy:       Head (right side): No submandibular adenopathy present.       Head (left side): No submandibular adenopathy present.    She has no cervical adenopathy.  Neurological: She is alert. She displays no tremor. No cranial nerve deficit. She exhibits normal muscle tone. Gait normal.  Reflex Scores:      Patellar reflexes are 2+ on the right side and 2+ on the left side. Skin: Skin is warm and dry. No bruising and no ecchymosis noted. No cyanosis. No pallor.  Psychiatric: Her speech is normal and behavior is normal. Thought content normal. Her mood appears not anxious. She does not exhibit a depressed mood.    Assessment/Plan:    Problem List Items Addressed This Visit      Other   Screening for cervical cancer    UTD      Screen for STD (sexually transmitted disease)    Check labs      Relevant Orders   C. trachomatis/N. gonorrhoeae RNA   Hepatitis panel, acute   RPR   WET PREP BY MOLECULAR PROBE   Preventative health care - Primary    USPSTF grade A and B recommendations reviewed with patient; age-appropriate recommendations, preventive care, screening tests, etc discussed and  encouraged; healthy living encouraged; see AVS for patient education given to patient       Relevant Orders   CBC with Differential/Platelet   COMPLETE METABOLIC PANEL WITH GFR   TSH   Lipid panel   Encounter for screening for HIV    check      Relevant Orders   HIV antibody    Other Visit Diagnoses    Underweight       reviewed this with patient; could be food anxiety from her symptoms, malabsorption; she denies eating disorder when asked directly   Esophageal dysphagia       refer to GI, ddx includes eos esoph, stricture, etc.   Relevant Orders   Ambulatory referral to Gastroenterology   Need for tetanus booster       offered and given today   Relevant Orders   Tdap vaccine greater than or equal to 7yo IM (Completed)   Diarrhea, unspecified type       will check stool studies; refer to GI; check -lytes, CBC, eos; consider IBS   Relevant Orders   Gastrointestinal Pathogen Panel PCR       No orders of the defined types were placed in this encounter.  Orders Placed This Encounter  Procedures  . C. trachomatis/N. gonorrhoeae RNA  . WET PREP BY MOLECULAR PROBE  . Tdap vaccine greater than or equal to 7yo IM  . Hepatitis panel, acute  . HIV antibody  . RPR  . CBC with Differential/Platelet  . COMPLETE METABOLIC PANEL WITH GFR  . TSH  . Lipid panel  . Gastrointestinal Pathogen Panel PCR  . Ambulatory referral to Gastroenterology    Referral Priority:   Routine    Referral Type:   Consultation     Referral Reason:   Specialty Services Required    Referred to Provider:   Lucilla Lame, MD    Number of Visits Requested:   1    Follow up plan: Return in about 1 year (around 07/12/2018) for complete physical; 6-8 weeks for follow-up.  An After Visit Summary was printed and given to the patient.

## 2017-07-13 LAB — LIPID PANEL
CHOLESTEROL: 174 mg/dL (ref <200)
HDL: 91 mg/dL (ref >50)
LDL CHOLESTEROL (CALC): 70 mg/dL
Non-HDL Cholesterol (Calc): 83 mg/dL (calc) (ref <130)
TRIGLYCERIDES: 53 mg/dL (ref <150)
Total CHOL/HDL Ratio: 1.9 (calc) (ref <5.0)

## 2017-07-13 LAB — COMPLETE METABOLIC PANEL WITH GFR
AG Ratio: 1.5 (calc) (ref 1.0–2.5)
ALBUMIN MSPROF: 4.9 g/dL (ref 3.6–5.1)
ALT: 11 U/L (ref 6–29)
AST: 16 U/L (ref 10–30)
Alkaline phosphatase (APISO): 52 U/L (ref 33–115)
BUN: 11 mg/dL (ref 7–25)
CALCIUM: 9.8 mg/dL (ref 8.6–10.2)
CO2: 24 mmol/L (ref 20–32)
CREATININE: 0.74 mg/dL (ref 0.50–1.10)
Chloride: 104 mmol/L (ref 98–110)
GFR, EST NON AFRICAN AMERICAN: 114 mL/min/{1.73_m2} (ref >=60)
GFR, Est African American: 132 mL/min/{1.73_m2} (ref >=60)
GLUCOSE: 98 mg/dL (ref 65–99)
Globulin: 3.3 g/dL (calc) (ref 1.9–3.7)
Potassium: 3.9 mmol/L (ref 3.5–5.3)
Sodium: 139 mmol/L (ref 135–146)
Total Bilirubin: 0.5 mg/dL (ref 0.2–1.2)
Total Protein: 8.2 g/dL — ABNORMAL HIGH (ref 6.1–8.1)

## 2017-07-13 LAB — CBC WITH DIFFERENTIAL/PLATELET
BASOS PCT: 0.4 %
Basophils Absolute: 21 cells/uL (ref 0–200)
EOS PCT: 1.2 %
Eosinophils Absolute: 62 cells/uL (ref 15–500)
HCT: 45.7 % — ABNORMAL HIGH (ref 35.0–45.0)
HEMOGLOBIN: 15.2 g/dL (ref 11.7–15.5)
Lymphs Abs: 1518 cells/uL (ref 850–3900)
MCH: 30.2 pg (ref 27.0–33.0)
MCHC: 33.3 g/dL (ref 32.0–36.0)
MCV: 90.7 fL (ref 80.0–100.0)
MONOS PCT: 6.1 %
MPV: 11.1 fL (ref 7.5–12.5)
Neutro Abs: 3281 cells/uL (ref 1500–7800)
Neutrophils Relative %: 63.1 %
Platelets: 153 10*3/uL (ref 140–400)
RBC: 5.04 10*6/uL (ref 3.80–5.10)
RDW: 12 % (ref 11.0–15.0)
Total Lymphocyte: 29.2 %
WBC mixed population: 317 cells/uL (ref 200–950)
WBC: 5.2 10*3/uL (ref 3.8–10.8)

## 2017-07-13 LAB — HEPATITIS PANEL, ACUTE
HEP B C IGM: NONREACTIVE
HEP B S AG: NONREACTIVE
Hep A IgM: NONREACTIVE
Hepatitis C Ab: NONREACTIVE
SIGNAL TO CUT-OFF: 0.01 (ref <1.00)

## 2017-07-13 LAB — HIV ANTIBODY (ROUTINE TESTING W REFLEX): HIV: NONREACTIVE

## 2017-07-13 LAB — TSH: TSH: 0.91 mIU/L

## 2017-07-13 LAB — C. TRACHOMATIS/N. GONORRHOEAE RNA
C. trachomatis RNA, TMA: NOT DETECTED
N. gonorrhoeae RNA, TMA: NOT DETECTED

## 2017-07-13 LAB — RPR: RPR: NONREACTIVE

## 2017-07-19 ENCOUNTER — Encounter: Payer: Self-pay | Admitting: Gastroenterology

## 2017-08-24 ENCOUNTER — Ambulatory Visit: Payer: BLUE CROSS/BLUE SHIELD | Admitting: Family Medicine

## 2017-09-18 ENCOUNTER — Encounter: Payer: Self-pay | Admitting: Gastroenterology

## 2017-09-18 ENCOUNTER — Ambulatory Visit: Payer: BLUE CROSS/BLUE SHIELD | Admitting: Gastroenterology

## 2017-09-18 VITALS — BP 120/83 | HR 86 | Ht 63.0 in | Wt 98.6 lb

## 2017-09-18 DIAGNOSIS — R110 Nausea: Secondary | ICD-10-CM

## 2017-09-18 DIAGNOSIS — R197 Diarrhea, unspecified: Secondary | ICD-10-CM

## 2017-09-18 MED ORDER — DICYCLOMINE HCL 10 MG PO CAPS: 10.0000 mg | ORAL_CAPSULE | Freq: Three times a day (TID) | ORAL | 3 refills | Status: DC

## 2017-09-18 NOTE — Progress Notes (Signed)
Gastroenterology Consultation  Referring Provider:     Kerman Passey, MD Primary Care Physician:  Allison Passey, MD Primary Gastroenterologist:  Dr. Servando Oliver     Reason for Consultation:     Nausea        HPI:   Allison Oliver is a 22 y.o. y/o female referred for consultation & management of Nausea by Dr. Sherie Oliver, Allison Bern, MD.  This patient comes in today with a report of nausea.  The patient states her nausea is mostly in the morning.  She also states that she feels a irritation the back of her throat.  The patient's nausea is most prominent in the morning but can last all day.  She also reports that she has loose bowel movements 3 times a day.  She usually states that it happens right after she eats.  There is no report of any loose bowel movements that wake her up from a sleep nor does she have any abdominal pain associated with her loose bowel movement.  The patient denies taking any milk but does report that she eats cheese and ice cream.  She has not taken noted to see if this makes her symptoms any worse.  The patient does report that she has had some weight loss because of a decreased appetite.  She states that she smells food and then does not want to eat it.  She denies any family history of aflutter bowel disease nor does she have any black stools or bloody stools.  Past Medical History:  Diagnosis Date  . ADHD (attention deficit hyperactivity disorder)    history of  . Chronic UTI (urinary tract infection)   . Herpes genitalia   . Insomnia due to stress     History reviewed. No pertinent surgical history.  Prior to Admission medications   Medication Sig Start Date End Date Taking? Authorizing Provider  dicyclomine (BENTYL) 10 MG capsule Take 1 capsule (10 mg total) by mouth 3 (three) times daily before meals. 09/18/17   Allison Minium, MD  nitrofurantoin (MACRODANTIN) 50 MG capsule Take 1 capsule (50 mg total) by mouth daily as needed. Patient not taking: Reported on  09/18/2017 05/23/16   Allison Passey, MD  valACYclovir (VALTREX) 1000 MG tablet Take 1 tablet (1,000 mg total) by mouth daily. For suppression; one tablet twice a day for 3 days for outbreaks Patient not taking: Reported on 07/11/2017 08/08/16   Allison Passey, MD    Family History  Problem Relation Age of Onset  . Drug abuse Father   . Cervical cancer Sister        HPV?  Marland Kitchen Cancer Maternal Grandfather        lung cancer  . Heart attack Paternal Grandfather      Social History   Tobacco Use  . Smoking status: Current Some Day Smoker    Types: E-cigarettes  . Smokeless tobacco: Never Used  Substance Use Topics  . Alcohol use: Yes    Alcohol/week: 0.0 standard drinks    Comment: occasional  . Drug use: Yes    Types: Marijuana    Allergies as of 09/18/2017  . (No Known Allergies)    Review of Systems:    All systems reviewed and negative except where noted in HPI.   Physical Exam:  BP 120/83   Pulse 86   Ht 5\' 3"  (1.6 m)   Wt 98 lb 9.6 oz (44.7 kg)   BMI 17.47 kg/m  No LMP  recorded. General:   Alert,  Well-developed, well-nourished, pleasant and cooperative in NAD Head:  Normocephalic and atraumatic. Eyes:  Sclera clear, no icterus.   Conjunctiva pink. Ears:  Normal auditory acuity. Nose:  No deformity, discharge, or lesions. Mouth:  No deformity or lesions,oropharynx pink & moist. Neck:  Supple; no masses or thyromegaly. Lungs:  Respirations even and unlabored.  Clear throughout to auscultation.   No wheezes, crackles, or rhonchi. No acute distress. Heart:  Regular rate and rhythm; no murmurs, clicks, rubs, or gallops. Abdomen:  Normal bowel sounds.  No bruits.  Soft, non-tender and non-distended without masses, hepatosplenomegaly or hernias noted.  No guarding or rebound tenderness.  Negative Carnett sign.   Rectal:  Deferred.  Msk:  Symmetrical without gross deformities.  Good, equal movement & strength bilaterally. Pulses:  Normal pulses noted. Extremities:  No  clubbing or edema.  No cyanosis. Neurologic:  Alert and oriented x3;  grossly normal neurologically. Skin:  Intact without significant lesions or rashes.  No jaundice. Lymph Nodes:  No significant cervical adenopathy. Psych:  Alert and cooperative. Normal mood and affect.  Imaging Studies: No results found.  Assessment and Plan:   Allison Oliver is a 22 y.o. y/o female who comes in with a history of symptoms consistent with reflux with morning nausea and irritation of the throat which may be due to her acid reflux.  The patient also has postprandial diarrhea. This is likely due to irritable bowel syndrome.  The patient will be started on a evening dose of Dexilant and we'll try that for 2 weeks to see if it helps her symptoms.  The patient will also be started on a low-dose of dicyclomine at 10 mg 3 times a day to be taken right before meals to see if that helps with her diarrhea.  If she continues to have the symptoms of diarrhea after eating I will instruct her to start a fiber supplement such as Citrucel.  The patient has been explained the plan and agrees with it.  Allison Minium, MD. Allison Oliver    Note: This dictation was prepared with Dragon dictation along with smaller phrase technology. Any transcriptional errors that result from this process are unintentional.

## 2017-09-20 ENCOUNTER — Other Ambulatory Visit: Payer: Self-pay | Admitting: Family Medicine

## 2017-09-20 MED ORDER — VALACYCLOVIR HCL 1 G PO TABS: 1000.0000 mg | ORAL_TABLET | Freq: Every day | ORAL | 11 refills | Status: DC

## 2017-09-21 ENCOUNTER — Other Ambulatory Visit: Payer: Self-pay

## 2017-09-24 ENCOUNTER — Telehealth: Payer: Self-pay

## 2017-09-24 DIAGNOSIS — Z113 Encounter for screening for infections with a predominantly sexual mode of transmission: Secondary | ICD-10-CM

## 2017-09-24 NOTE — Telephone Encounter (Signed)
Dr. Sherie DonLada wanted me to call she is trying to close care gaps and would like for you to come in for chlamydia testing by the end of the week.  Please call back if you have any questions for her.    Pt states she will come in tomorrow

## 2017-09-25 ENCOUNTER — Other Ambulatory Visit: Payer: Self-pay

## 2017-09-25 NOTE — Telephone Encounter (Signed)
Patient is calling and is wanting more information on why Dr. Sherie DonLada is wanting to check her for chlamydia. Patient states she has not been seen since July and this is odd. Please contact her.  [phone]

## 2017-09-25 NOTE — Telephone Encounter (Signed)
I called her and explained; she will come in Thursday

## 2017-09-27 ENCOUNTER — Encounter: Payer: Self-pay | Admitting: Family Medicine

## 2017-09-27 ENCOUNTER — Ambulatory Visit: Payer: BLUE CROSS/BLUE SHIELD | Admitting: Family Medicine

## 2017-09-27 VITALS — BP 116/70 | HR 95 | Temp 98.1°F | Ht 63.0 in | Wt 97.3 lb

## 2017-09-27 DIAGNOSIS — Z23 Encounter for immunization: Secondary | ICD-10-CM | POA: Diagnosis not present

## 2017-09-27 DIAGNOSIS — R636 Underweight: Secondary | ICD-10-CM

## 2017-09-27 DIAGNOSIS — K219 Gastro-esophageal reflux disease without esophagitis: Secondary | ICD-10-CM | POA: Insufficient documentation

## 2017-09-27 DIAGNOSIS — B009 Herpesviral infection, unspecified: Secondary | ICD-10-CM | POA: Diagnosis not present

## 2017-09-27 MED ORDER — VALACYCLOVIR HCL 500 MG PO TABS: ORAL_TABLET | ORAL | 11 refills | Status: AC

## 2017-09-27 NOTE — Assessment & Plan Note (Signed)
Patient denies eating disorder; feels fine; she is eating fine, loves to eat; likely more related to inadequate absorption, as patient reports frequent stooling right after eating; encouraged her to talk with GI if the new medicines are not helping in that regard

## 2017-09-27 NOTE — Progress Notes (Signed)
BP 116/70   Pulse 95   Temp 98.1 F (36.7 C)   Ht 5\' 3"  (1.6 m)   Wt 97 lb 4.8 oz (44.1 kg)   LMP 09/22/2017   SpO2 98%   BMI 17.24 kg/m    Subjective:    Patient ID: Allison Oliver, female    DOB: [birth year], 22 y.o.   MRN: [id number]  HPI: Allison Oliver is a 22 y.o. female  Chief Complaint  Patient presents with  . Follow-up    HPI Patient is here for f/u She was seen July 11, 2017 She went to see Dr. Servando Snare last week; he gave her two medicines for nausea and reflux dexilant and bentyl; considering EGD  Recent chl done in July and normal and UTD; discussed that she did not need this repeated and we are sorry that she was contacted about having this done  She had been on the valtrex; however, she would like to use the 500 mg pills instead of the bigger 1000 mg pills; she does not think the 1000 mg is any better than the 500 mg strength at prevention  Weight discussed; she was 92 pounds a few weeks ago, gaining again; she feels like the medicines from GI are helping; no eating disorder; loves food, "I promise" she says; she eats and then is always using the bathroom; she says she feels great; no night sweats; she is not worried, "I feel fine"  Depression screen [facility] 2/9 09/27/2017 07/11/2017 08/08/2016 05/23/2016 09/07/2015  Decreased Interest 0 0 0 0 0  Down, Depressed, Hopeless 0 0 0 0 0  PHQ - 2 Score 0 0 0 0 0  Altered sleeping - 0 - 0 -  Tired, decreased energy - 0 - 0 -  Change in appetite - 3 - 0 -  Feeling bad or failure about yourself  - 0 - 0 -  Trouble concentrating - 0 - 0 -  Moving slowly or fidgety/restless - 0 - 0 -  Suicidal thoughts - 0 - 0 -  PHQ-9 Score - 3 - 0 -    Relevant past medical, surgical, family and social history reviewed Past Medical History:  Diagnosis Date  . ADHD (attention deficit hyperactivity disorder)    history of  . Chronic UTI (urinary tract infection)   . Herpes genitalia   . Insomnia due to stress    History  reviewed. No pertinent surgical history. Family History  Problem Relation Age of Onset  . Drug abuse Father   . Cervical cancer Sister        HPV?  Marland Kitchen Cancer Maternal Grandfather        lung cancer  . Heart attack Paternal Grandfather    Social History   Tobacco Use  . Smoking status: Current Some Day Smoker    Types: E-cigarettes  . Smokeless tobacco: Never Used  Substance Use Topics  . Alcohol use: Yes    Alcohol/week: 0.0 standard drinks    Comment: occasional  . Drug use: Yes    Types: Marijuana    Interim medical history since last visit reviewed. Allergies and medications reviewed  Review of Systems Per HPI unless specifically indicated above     Objective:    BP 116/70   Pulse 95   Temp 98.1 F (36.7 C)   Ht 5\' 3"  (1.6 m)   Wt 97 lb 4.8 oz (44.1 kg)   LMP 09/22/2017   SpO2 98%   BMI 17.24 kg/m  Wt Readings from Last 3 Encounters:  09/27/17 97 lb 4.8 oz (44.1 kg)  09/18/17 98 lb 9.6 oz (44.7 kg)  07/11/17 101 lb 6.4 oz (46 kg)    Physical Exam  Constitutional: She appears well-developed and well-nourished.  Small-framed; does not appear cachectic or malnourished  HENT:  Mouth/Throat: Mucous membranes are normal.  Eyes: EOM are normal. No scleral icterus.  Cardiovascular: Normal rate and regular rhythm.  Pulmonary/Chest: Effort normal and breath sounds normal.  Psychiatric: She has a normal mood and affect. Her behavior is normal.    Results for orders placed or performed in visit on 07/11/17  C. trachomatis/N. gonorrhoeae RNA  Result Value Ref Range   C. trachomatis RNA, TMA NOT DETECTED NOT DETECT   N. gonorrhoeae RNA, TMA NOT DETECTED NOT DETECT  Hepatitis panel, acute  Result Value Ref Range   Hep A IgM NON-REACTIVE NON-REACTI   Hepatitis B Surface Ag NON-REACTIVE NON-REACTI   Hep B C IgM NON-REACTIVE NON-REACTI   Hepatitis C Ab NON-REACTIVE NON-REACTI   SIGNAL TO CUT-OFF 0.01 <1.00  HIV antibody  Result Value Ref Range   HIV 1&2 Ab,  4th Generation NON-REACTIVE NON-REACTI  RPR  Result Value Ref Range   RPR Ser Ql NON-REACTIVE NON-REACTI  CBC with Differential/Platelet  Result Value Ref Range   WBC 5.2 3.8 - 10.8 Thousand/uL   RBC 5.04 3.80 - 5.10 Million/uL   Hemoglobin 15.2 11.7 - 15.5 g/dL   HCT 16.145.7 (H) 09.635.0 - 04.545.0 %   MCV 90.7 80.0 - 100.0 fL   MCH 30.2 27.0 - 33.0 pg   MCHC 33.3 32.0 - 36.0 g/dL   RDW 40.912.0 81.111.0 - 91.415.0 %   Platelets 153 140 - 400 Thousand/uL   MPV 11.1 7.5 - 12.5 fL   Neutro Abs 3,281 1,500 - 7,800 cells/uL   Lymphs Abs 1,518 850 - 3,900 cells/uL   WBC mixed population 317 200 - 950 cells/uL   Eosinophils Absolute 62 15 - 500 cells/uL   Basophils Absolute 21 0 - 200 cells/uL   Neutrophils Relative % 63.1 %   Total Lymphocyte 29.2 %   Monocytes Relative 6.1 %   Eosinophils Relative 1.2 %   Basophils Relative 0.4 %  COMPLETE METABOLIC PANEL WITH GFR  Result Value Ref Range   Glucose, Bld 98 65 - 99 mg/dL   BUN 11 7 - 25 mg/dL   Creat 7.820.74 9.560.50 - 2.131.10 mg/dL   GFR, Est Non African American 114 > OR = 60 mL/min/1.3573m2   GFR, Est African American 132 > OR = 60 mL/min/1.6173m2   BUN/Creatinine Ratio NOT APPLICABLE 6 - 22 (calc)   Sodium 139 135 - 146 mmol/L   Potassium 3.9 3.5 - 5.3 mmol/L   Chloride 104 98 - 110 mmol/L   CO2 24 20 - 32 mmol/L   Calcium 9.8 8.6 - 10.2 mg/dL   Total Protein 8.2 (H) 6.1 - 8.1 g/dL   Albumin 4.9 3.6 - 5.1 g/dL   Globulin 3.3 1.9 - 3.7 g/dL (calc)   AG Ratio 1.5 1.0 - 2.5 (calc)   Total Bilirubin 0.5 0.2 - 1.2 mg/dL   Alkaline phosphatase (APISO) 52 33 - 115 U/L   AST 16 10 - 30 U/L   ALT 11 6 - 29 U/L  TSH  Result Value Ref Range   TSH 0.91 mIU/L  Lipid panel  Result Value Ref Range   Cholesterol 174 <200 mg/dL   HDL 91 >08>50 mg/dL   Triglycerides 53 <  150 mg/dL   LDL Cholesterol (Calc) 70 mg/dL (calc)   Total CHOL/HDL Ratio 1.9 <5.0 (calc)   Non-HDL Cholesterol (Calc) 83 <161 mg/dL (calc)      Assessment & Plan:   Problem List Items Addressed  This Visit      Digestive   Gastroesophageal reflux disease    Seeing GI; appreciate his assistance; she'll continue the new meds; work w/him, considering EGD      Relevant Medications   Dexlansoprazole (DEXILANT) 30 MG capsule     Other   Underweight - Primary    Patient denies eating disorder; feels fine; she is eating fine, loves to eat; likely more related to inadequate absorption, as patient reports frequent stooling right after eating; encouraged her to talk with GI if the new medicines are not helping in that regard      Herpes simplex infection    Will decrease suppression dose to 500 mg daily; allowed extra pills for outbreaks      Relevant Medications   valACYclovir (VALTREX) 500 MG tablet    Other Visit Diagnoses    Need for influenza vaccination       Relevant Orders   Flu Vaccine QUAD 6+ mos PF IM (Fluarix Quad PF) (Completed)       Follow up plan: Return in about 3 months (around 12/27/2017) for follow-up visit with Dr. Sherie Don; call sooner if needed, okay to cancel if great.  An after-visit summary was printed and given to the patient at check-out.  Please see the patient instructions which may contain other information and recommendations beyond what is mentioned above in the assessment and plan.  Meds ordered this encounter  Medications  . valACYclovir (VALTREX) 500 MG tablet    Sig: One by mouth daily for prevention; take two pills twice a day for 3 days for outbreaks    Dispense:  36 tablet    Refill:  11    Orders Placed This Encounter  Procedures  . Flu Vaccine QUAD 6+ mos PF IM (Fluarix Quad PF)

## 2017-09-27 NOTE — Assessment & Plan Note (Signed)
Will decrease suppression dose to 500 mg daily; allowed extra pills for outbreaks

## 2017-09-27 NOTE — Assessment & Plan Note (Signed)
Seeing GI; appreciate his assistance; she'll continue the new meds; work w/him, considering EGD

## 2017-09-27 NOTE — Patient Instructions (Addendum)
If having trouble with frequent bowel movements and not absorbing calories, then work with Dr. Servando SnareWohl Do try to get enough calories daily Avoid dairy completely, use almond milk or other source for calcium

## 2017-11-13 ENCOUNTER — Telehealth: Payer: Self-pay | Admitting: Family Medicine

## 2017-11-13 NOTE — Telephone Encounter (Signed)
Please check my last prescription; thank you

## 2017-11-13 NOTE — Telephone Encounter (Signed)
Copied from CRM [id number]. Topic: Quick Communication - Rx Refill/Question >> Nov 13, 2017 10:03 AM Floria Raveling A wrote: Medication: valACYclovir (VALTREX) 500 MG tablet [[id number]]   Has the patient contacted their pharmacy? No. (Agent: If no, request that the patient contact the pharmacy for the refill.) (Agent: If yes, when and what did the pharmacy advise?)  Preferred Pharmacy (with phone number or street name): [id number] [place], [place] - [address] AT [address] OF [address] [phone] (Phone)   Agent: Please be advised that RX refills may take up to 3 business days. We ask that you follow-up with your pharmacy.

## 2017-11-13 NOTE — Telephone Encounter (Signed)
Left detailed voicemail no rx needed at this time, call pharmacy for refills

## 2017-12-27 ENCOUNTER — Ambulatory Visit: Payer: BLUE CROSS/BLUE SHIELD | Admitting: Family Medicine

## 2018-05-01 ENCOUNTER — Ambulatory Visit (INDEPENDENT_AMBULATORY_CARE_PROVIDER_SITE_OTHER): Payer: BLUE CROSS/BLUE SHIELD | Admitting: Obstetrics & Gynecology

## 2018-05-01 ENCOUNTER — Encounter: Payer: Self-pay | Admitting: Obstetrics & Gynecology

## 2018-05-01 ENCOUNTER — Other Ambulatory Visit: Payer: Self-pay

## 2018-05-01 ENCOUNTER — Other Ambulatory Visit (HOSPITAL_COMMUNITY)
Admission: RE | Admit: 2018-05-01 | Discharge: 2018-05-01 | Disposition: A | Discharge status: Home / Self Care | Payer: BLUE CROSS/BLUE SHIELD | Source: Ambulatory Visit | Attending: Obstetrics & Gynecology | Admitting: Obstetrics & Gynecology

## 2018-05-01 VITALS — BP 120/80 | Wt 101.0 lb

## 2018-05-01 DIAGNOSIS — Z331 Pregnant state, incidental: Secondary | ICD-10-CM | POA: Diagnosis not present

## 2018-05-01 DIAGNOSIS — O9989 Other specified diseases and conditions complicating pregnancy, childbirth and the puerperium: Secondary | ICD-10-CM | POA: Diagnosis not present

## 2018-05-01 DIAGNOSIS — Z3A08 8 weeks gestation of pregnancy: Secondary | ICD-10-CM

## 2018-05-01 DIAGNOSIS — R8271 Bacteriuria: Secondary | ICD-10-CM

## 2018-05-01 DIAGNOSIS — Z3201 Encounter for pregnancy test, result positive: Secondary | ICD-10-CM

## 2018-05-01 DIAGNOSIS — Z124 Encounter for screening for malignant neoplasm of cervix: Secondary | ICD-10-CM

## 2018-05-01 DIAGNOSIS — Z1389 Encounter for screening for other disorder: Secondary | ICD-10-CM

## 2018-05-01 DIAGNOSIS — O99891 Other specified diseases and conditions complicating pregnancy: Secondary | ICD-10-CM

## 2018-05-01 DIAGNOSIS — N926 Irregular menstruation, unspecified: Secondary | ICD-10-CM

## 2018-05-01 LAB — POCT URINE PREGNANCY: Preg Test, Ur: POSITIVE — AB

## 2018-05-01 MED ORDER — CONCEPT OB 130-92.4-1 MG PO CAPS: 1.0000 | ORAL_CAPSULE | Freq: Every day | ORAL | 11 refills | Status: DC

## 2018-05-01 MED ORDER — ONDANSETRON 4 MG PO TBDP: 4.0000 mg | ORAL_TABLET | Freq: Four times a day (QID) | ORAL | 0 refills | Status: DC | PRN

## 2018-05-01 NOTE — Progress Notes (Signed)
05/01/2018   Chief Complaint: Missed period  History of Present Illness: Ms. Allison Oliver is a 23 y.o. G1P0000 100w2d based on Patient's last menstrual period was 03/04/2018. with an Estimated Date of Delivery: 12/09/18, with the above CC.   Her periods were: regular periods every 28 days She was using no method when she conceived.  She has Positive signs or symptoms of nausea/vomiting of pregnancy. She has Negative signs or symptoms of miscarriage or preterm labor She identifies Negative Zika risk factors for her and her partner On any different medications around the time she conceived/early pregnancy: No  History of varicella: Yes   ROS: A 12-point review of systems was performed and negative, except as stated in the above HPI.  OBGYN History: As per HPI. OB History  Gravida Para Term Preterm AB Living  1 0 0 0 0 0  SAB TAB Ectopic Multiple Live Births  0 0 0 0      # Outcome Date GA Lbr Len/2nd Weight Sex Delivery Anes PTL Lv  1 Current             Any issues with any prior pregnancies: no Any prior children are healthy, doing well, without any problems or issues: not applicable History of pap smears: Yes. Last pap smear 2018. Abnormal: no  History of STIs: No   Past Medical History: Past Medical History:  Diagnosis Date  . ADHD (attention deficit hyperactivity disorder)    history of  . Chronic UTI (urinary tract infection)   . Herpes genitalia   . Insomnia due to stress     Past Surgical History: History reviewed. No pertinent surgical history.  Family History:  Family History  Problem Relation Age of Onset  . Drug abuse Father   . Cervical cancer Sister        HPV?  Marland Kitchen Cancer Maternal Grandfather        lung cancer  . Heart attack Paternal Grandfather    She denies any female cancers, bleeding or blood clotting disorders.  She denies any history of mental retardation, birth defects or genetic disorders in her or the FOB's history  Social History:  Social  History   Socioeconomic History  . Marital status: Single    Spouse name: Not on file  . Number of children: Not on file  . Years of education: Not on file  . Highest education level: High school graduate  Occupational History  . Not on file  Social Needs  . Financial resource strain: Not hard at all  . Food insecurity:    Worry: Never true    Inability: Never true  . Transportation needs:    Medical: No    Non-medical: No  Tobacco Use  . Smoking status: Current Some Day Smoker    Types: E-cigarettes  . Smokeless tobacco: Never Used  Substance and Sexual Activity  . Alcohol use: Yes    Alcohol/week: 0.0 standard drinks    Comment: occasional  . Drug use: Yes    Types: Marijuana  . Sexual activity: Yes    Partners: Male    Birth control/protection: Condom  Lifestyle  . Physical activity:    Days per week: 4 days    Minutes per session: 120 min  . Stress: Not at all  Relationships  . Social connections:    Talks on phone: More than three times a week    Gets together: More than three times a week    Attends religious service: Never  Active member of club or organization: No    Attends meetings of clubs or organizations: Never    Relationship status: Never married  . Intimate partner violence:    Fear of current or ex partner: No    Emotionally abused: No    Physically abused: No    Forced sexual activity: No  Other Topics Concern  . Not on file  Social History Narrative  . Not on file   Any pets in the household: no  Allergy: No Known Allergies  Current Outpatient Medications:  Current Outpatient Medications:  .  Dexlansoprazole (DEXILANT) 30 MG capsule, Take 1 capsule (30 mg total) by mouth daily., Disp: , Rfl:  .  valACYclovir (VALTREX) 500 MG tablet, One by mouth daily for prevention; take two pills twice a day for 3 days for outbreaks, Disp: 36 tablet, Rfl: 11 .  dicyclomine (BENTYL) 10 MG capsule, Take 1 capsule (10 mg total) by mouth 3 (three)  times daily before meals. (Patient not taking: Reported on 05/01/2018), Disp: 90 capsule, Rfl: 3 .  nitrofurantoin (MACRODANTIN) 50 MG capsule, Take 1 capsule (50 mg total) by mouth daily as needed. (Patient not taking: Reported on 05/01/2018), Disp: 30 capsule, Rfl: 5   Physical Exam:   BP 120/80   Wt 101 lb (45.8 kg)   LMP 03/04/2018   BMI 17.89 kg/m  Body mass index is 17.89 kg/m. Constitutional: Well nourished, well developed female in no acute distress.  Neck:  Supple, normal appearance, and no thyromegaly  Cardiovascular: S1, S2 normal, no murmur, rub or gallop, regular rate and rhythm Respiratory:  Clear to auscultation bilateral. Normal respiratory effort Abdomen: positive bowel sounds and no masses, hernias; diffusely non tender to palpation, non distended Breasts: breasts appear normal, no suspicious masses, no skin or nipple changes or axillary nodes. Neuro/Psych:  Normal mood and affect.  Skin:  Warm and dry.  Lymphatic:  No inguinal lymphadenopathy.   Pelvic exam: is not limited by body habitus EGBUS: within normal limits, Vagina: within normal limits and with no blood in the vault, Cervix: normal appearing cervix without discharge or lesions, closed/long/high, Uterus:  enlarged: 8 weeks, and Adnexa:  no mass, fullness, tenderness  Assessment: Allison Oliver is a 23 y.o. G1P0000 4739w2d based on Patient's last menstrual period was 03/04/2018. with an Estimated Date of Delivery: 12/09/18,  for prenatal care.  Plan:  1) Avoid alcoholic beverages. 2) Patient encouraged not to smoke.  3) Discontinue the use of all non-medicinal drugs and chemicals.  4) Take prenatal vitamins daily.  5) Seatbelt use advised 6) Nutrition, food safety (fish, cheese advisories, and high nitrite foods) and exercise discussed. 7) [facility] discussed  8) Patient is asked about travel to areas at risk for the Zika virus, and counseled to avoid travel and exposure to  mosquitoes or sexual partners who may have themselves been exposed to the virus. Testing is discussed, and will be ordered as appropriate.  9) [facility] advised 10) Genetic Screening, such as with 1st Trimester Screening, cell free fetal DNA, AFP testing, and Ultrasound, as well as with amniocentesis and CVS as appropriate, is discussed with patient. She plans to have genetic testing this pregnancy.  US and serum labs 10 weeks to combine w genetic testing as well PNV provided ([facility]) Zofran for nausea  Problem list reviewed and updated.  Annamarie MajorPaul Williette Loewe, MD, Merlinda FrederickFACOG [facility], [facility] 05/01/2018  4:26 PM

## 2018-05-01 NOTE — Patient Instructions (Signed)
First Trimester of Pregnancy The first trimester of pregnancy is from week 1 until the end of week 13 (months 1 through 3). A week after a sperm fertilizes an egg, the egg will implant on the wall of the uterus. This embryo will begin to develop into a baby. Genes from you and your partner will form the baby. The female genes will determine whether the baby will be a boy or a girl. At 6-8 weeks, the eyes and face will be formed, and the heartbeat can be seen on ultrasound. At the end of 12 weeks, all the baby's organs will be formed. Now that you are pregnant, you will want to do everything you can to have a healthy baby. Two of the most important things are to get good prenatal care and to follow your health care provider's instructions. Prenatal care is all the medical care you receive before the baby's birth. This care will help prevent, find, and treat any problems during the pregnancy and childbirth. Body changes during your first trimester Your body goes through many changes during pregnancy. The changes vary from woman to woman.  You may gain or lose a couple of pounds at first.  You may feel sick to your stomach (nauseous) and you may throw up (vomit). If the vomiting is uncontrollable, call your health care provider.  You may tire easily.  You may develop headaches that can be relieved by medicines. All medicines should be approved by your health care provider.  You may urinate more often. Painful urination may mean you have a bladder infection.  You may develop heartburn as a result of your pregnancy.  You may develop constipation because certain hormones are causing the muscles that push stool through your intestines to slow down.  You may develop hemorrhoids or swollen veins (varicose veins).  Your breasts may begin to grow larger and become tender. Your nipples may stick out more, and the tissue that surrounds them (areola) may become darker.  Your gums may bleed and may be  sensitive to brushing and flossing.  Dark spots or blotches (chloasma, mask of pregnancy) may develop on your face. This will likely fade after the baby is born.  Your menstrual periods will stop.  You may have a loss of appetite.  You may develop cravings for certain kinds of food.  You may have changes in your emotions from day to day, such as being excited to be pregnant or being concerned that something may go wrong with the pregnancy and baby.  You may have more vivid and strange dreams.  You may have changes in your hair. These can include thickening of your hair, rapid growth, and changes in texture. Some women also have hair loss during or after pregnancy, or hair that feels dry or thin. Your hair will most likely return to normal after your baby is born. What to expect at prenatal visits During a routine prenatal visit:  You will be weighed to make sure you and the baby are growing normally.  Your blood pressure will be taken.  Your abdomen will be measured to track your baby's growth.  The fetal heartbeat will be listened to between weeks 10 and 14 of your pregnancy.  Test results from any previous visits will be discussed. Your health care provider may ask you:  How you are feeling.  If you are feeling the baby move.  If you have had any abnormal symptoms, such as leaking fluid, bleeding, severe headaches, or abdominal  cramping.  If you are using any tobacco products, including cigarettes, chewing tobacco, and electronic cigarettes.  If you have any questions. Other tests that may be performed during your first trimester include:  Blood tests to find your blood type and to check for the presence of any previous infections. The tests will also be used to check for low iron levels (anemia) and protein on red blood cells (Rh antibodies). Depending on your risk factors, or if you previously had diabetes during pregnancy, you may have tests to check for high blood sugar  that affects pregnant women (gestational diabetes).  Urine tests to check for infections, diabetes, or protein in the urine.  An ultrasound to confirm the proper growth and development of the baby.  Fetal screens for spinal cord problems (spina bifida) and Down syndrome.  HIV (human immunodeficiency virus) testing. Routine prenatal testing includes screening for HIV, unless you choose not to have this test.  You may need other tests to make sure you and the baby are doing well. Follow these instructions at home: Medicines  Follow your health care provider's instructions regarding medicine use. Specific medicines may be either safe or unsafe to take during pregnancy.  Take a prenatal vitamin that contains at least 600 micrograms (mcg) of folic acid.  If you develop constipation, try taking a stool softener if your health care provider approves. Eating and drinking   Eat a balanced diet that includes fresh fruits and vegetables, whole grains, good sources of protein such as meat, eggs, or tofu, and low-fat dairy. Your health care provider will help you determine the amount of weight gain that is right for you.  Avoid raw meat and uncooked cheese. These carry germs that can cause birth defects in the baby.  Eating four or five small meals rather than three large meals a day may help relieve nausea and vomiting. If you start to feel nauseous, eating a few soda crackers can be helpful. Drinking liquids between meals, instead of during meals, also seems to help ease nausea and vomiting.  Limit foods that are high in fat and processed sugars, such as fried and sweet foods.  To prevent constipation: ? Eat foods that are high in fiber, such as fresh fruits and vegetables, whole grains, and beans. ? Drink enough fluid to keep your urine clear or pale yellow. Activity  Exercise only as directed by your health care provider. Most women can continue their usual exercise routine during  pregnancy. Try to exercise for 30 minutes at least 5 days a week. Exercising will help you: ? Control your weight. ? Stay in shape. ? Be prepared for labor and delivery.  Experiencing pain or cramping in the lower abdomen or lower back is a good sign that you should stop exercising. Check with your health care provider before continuing with normal exercises.  Try to avoid standing for long periods of time. Move your legs often if you must stand in one place for a long time.  Avoid heavy lifting.  Wear low-heeled shoes and practice good posture.  You may continue to have sex unless your health care provider tells you not to. Relieving pain and discomfort  Wear a good support bra to relieve breast tenderness.  Take warm sitz baths to soothe any pain or discomfort caused by hemorrhoids. Use hemorrhoid cream if your health care provider approves.  Rest with your legs elevated if you have leg cramps or low back pain.  If you develop varicose veins in  your legs, wear support hose. Elevate your feet for 15 minutes, 3-4 times a day. Limit salt in your diet. Prenatal care  Schedule your prenatal visits by the twelfth week of pregnancy. They are usually scheduled monthly at first, then more often in the last 2 months before delivery.  Write down your questions. Take them to your prenatal visits.  Keep all your prenatal visits as told by your health care provider. This is important. Safety  Wear your seat belt at all times when driving.  Make a list of emergency phone numbers, including numbers for family, friends, the hospital, and police and fire departments. General instructions  Ask your health care provider for a referral to a local prenatal education class. Begin classes no later than the beginning of month 6 of your pregnancy.  Ask for help if you have counseling or nutritional needs during pregnancy. Your health care provider can offer advice or refer you to specialists for help  with various needs.  Do not use hot tubs, steam rooms, or saunas.  Do not douche or use tampons or scented sanitary pads.  Do not cross your legs for long periods of time.  Avoid cat litter boxes and soil used by cats. These carry germs that can cause birth defects in the baby and possibly loss of the fetus by miscarriage or stillbirth.  Avoid all smoking, herbs, alcohol, and medicines not prescribed by your health care provider. Chemicals in these products affect the formation and growth of the baby.  Do not use any products that contain nicotine or tobacco, such as cigarettes and e-cigarettes. If you need help quitting, ask your health care provider. You may receive counseling support and other resources to help you quit.  Schedule a dentist appointment. At home, brush your teeth with a soft toothbrush and be gentle when you floss. Contact a health care provider if:  You have dizziness.  You have mild pelvic cramps, pelvic pressure, or nagging pain in the abdominal area.  You have persistent nausea, vomiting, or diarrhea.  You have a bad smelling vaginal discharge.  You have pain when you urinate.  You notice increased swelling in your face, hands, legs, or ankles.  You are exposed to fifth disease or chickenpox.  You are exposed to [place] measles (rubella) and have never had it. Get help right away if:  You have a fever.  You are leaking fluid from your vagina.  You have spotting or bleeding from your vagina.  You have severe abdominal cramping or pain.  You have rapid weight gain or loss.  You vomit blood or material that looks like coffee grounds.  You develop a severe headache.  You have shortness of breath.  You have any kind of trauma, such as from a fall or a car accident. Summary  The first trimester of pregnancy is from week 1 until the end of week 13 (months 1 through 3).  Your body goes through many changes during pregnancy. The changes vary from  woman to woman.  You will have routine prenatal visits. During those visits, your health care provider will examine you, discuss any test results you may have, and talk with you about how you are feeling. This information is not intended to replace advice given to you by your health care provider. Make sure you discuss any questions you have with your health care provider. Document Released: 12/20/2000 Document Revised: 12/08/2015 Document Reviewed: 12/08/2015 Elsevier Interactive Patient Education  2019 [employer].  Hello,  Given the current COVID-19 pandemic, our practice is making changes in how we are providing care to our patients. We are limiting in-person visits for the safety of all of our patients.   As a practice, we have met to discuss the best way to minimize visits, but still provide excellent care to our expecting mothers.  We have decided on the following visit structure for low-risk pregnancies.  Initial Pregnancy visit will be conducted as a telephone or web visit.  Between 10-14 weeks  there will be one in-person visit for an ultrasound, lab work, and genetic screening. 20 weeks in-person visit with an anatomy ultrasound  28 weeks in-person office visit for a 1-hour glucose test and a TDAP vaccination 32 weeks in-person office visit 34 weeks telephone visit 36 weeks in-person office visit for GBS, chlamydia, and gonorrhea testing 38 weeks in-person office visit 40 weeks in-person office visit  Understandably, some patients will require more visits than what is outlined above. Additional visits will be determined on a case-by-case basis.   We will, as always, be available for emergencies or to address concerns that might arise between in-person visits. We ask that you allow [place] the opportunity to address any concerns over the phone or through a virtual visit first. We will be available to return your phone calls throughout the day.   If you are able to purchase a  scale, a blood pressure machine, and a home fetal doppler visits could be limited further. This will help decrease your exposure risks, but these purchases are not a necessity.   Things seem to change daily and there is the possibility that this structure could change, please be patient as we adapt to a new way of caring for patients.   Thank you for trusting [place] with your prenatal care. Our practice values you and looks forward to providing you with excellent care.   Sincerely,   [facility], [place]    COVID-19 and Your Pregnancy FAQ  How can I prevent infection with COVID-19 during my pregnancy? Social distancing is key. Please limit any interactions in public. Try and work from home if possible. Frequently wash your hands after touching possibly contaminated surfaces. Avoid touching your face.  Minimize trips to the store. Consider online ordering when possible.   Should I wear a mask? YES. It is recommended by the CDC that all people wear a cloth mask or facial covering in public. This will help reduce transmission as well as your risk or acquiring COVID-19. New studies are showing that even asymptomatic individuals can spread the virus from talking.   What are the symptoms of COVID-19? Fever (greater than 100.4 F), dry cough, shortness of breath.  Am I more at risk for COVID-19 since I am pregnant? There is not currently data showing that pregnant women are more adversely impacted by COVID-19 than the general population. However, we know that pregnant women tend to have worse respiratory complications from similar diseases such as the flu and SARS and for this reason should be considered an at-risk population.  What do I do if I am experiencing the symptoms of COVID-19? Testing is being limited because of test availability. If you are experiencing symptoms you should quarantine yourself, and the members of your family, for at least 2 weeks at home.   Please  visit this website for more information: [email or web address]  When should I go to the Emergency Room? Please go to the emergency room if you  are experiencing ANY of these symptoms*:  1.    Difficulty breathing or shortness of breath 2.    Persistent pain or pressure in the chest 3.    Confusion or difficulty being aroused (or awakened) 4.    Bluish lips or face  *This list is not all inclusive. Please consult our office for any other symptoms that are severe or concerning.  What do I do if I am having difficulty breathing? You should go to the Emergency Room for evaluation. At this time they have a tent set up for evaluating patients with COVID-19 symptoms.   How will my prenatal care be different because of the COVID-19 pandemic? It has been recommended to reduce the frequency of face-to-face visits and use resources such as telephone and virtual visits when possible. Using a scale, blood pressure machine and fetal doppler at home can further help reduce face-to-face visits. You will be provided with additional information on this topic.  We ask that you come to your visits alone to minimize potential exposures to  COVID-19.  How can I receive childbirth education? At this time in-person classes have been cancelled. You can register for online childbirth education, breastfeeding, and newborn care classes.  Please visit:  [email or web address].hu for more information  How will my hospital birth experience be different? The hospital is currently limiting visitors. This means that while you are in labor you can only have one person at the hospital with you. Additional family members will not be allowed to wait in the building or outside your room. Your one support person can be the father of the baby, a relative, a doula, or a friend. Once one support person is designated that person will wear a band. This band cannot be shared with  multiple people.  Nitrous Gas is not being offered for pain relief since the tubing and filter for the machine can not be sanitized in a way to guarantee prevention of transmission of COVID-19.  Nasal cannula use of oxygen for fetal indications has also been discontinued.  Currently a clear plastic sheet is being hung between mom and the delivering provider during pushing and delivery to help prevent transmission of COVID-19.      How long will I stay in the hospital for after giving birth? It is also recommended that discharge home be expedited during the COVID-19 outbreak. This means staying for 1 day after a vaginal delivery and 2 days after a cesarean section. Patients who need to stay longer for medical reasons are allowed to do so, but the goal will be for expedited discharge home.   What if I have COVID-19 and I am in labor? We ask that you wear a mask while on labor and delivery. We will try and accommodate you being placed in a room that is capable of filtering the air. Please call ahead if you are in labor and on your way to the hospital. The phone number for labor and delivery at [facility] is [phone].  If I have COVID-19 when my baby is born how can I prevent my baby from contracting COVID-19? This is an issue that will have to be discussed on a case-by-case basis. Current recommendations suggest providing separate isolation rooms for both the mother and new infant as well as limiting visitors. However, there are practical challenges to this recommendation. The situation will assuredly change and decisions will be influenced by the desires of the mother and availability of space.  Some  suggestions are the use of a curtain or physical barrier between mom and infant, hand hygiene, mom wearing a mask, or 6 feet of spacing between a mom and infant.   Can I breastfeed during the COVID-19 pandemic?   Yes, breastfeeding is encouraged.  Can I breastfeed if I have  COVID-19? Yes. Covid-19 has not been found in breast milk. This means you cannot give COVID-19 to your child through breast milk. Breast feeding will also help pass antibodies to fight infection to your baby.   What precautions should I take when breastfeeding if I have COVID-19? If a mother and newborn do room-in and the mother wishes to feed at the breast, she should put on a facemask and practice hand hygiene before each feeding.  What precautions should I take when pumping if I have COVID-19? Prior to expressing breast milk, mothers should practice hand hygiene. After each pumping session, all parts that come into contact with breast milk should be thoroughly washed and the entire pump should be appropriately disinfected per the manufacturers instructions. This expressed breast milk should be fed to the newborn by a healthy caregiver.  What if I am pregnant and work in healthcare? Based on limited data regarding COVID-19 and pregnancy, ACOG currently does not propose creating additional restrictions on pregnant health care personnel because of COVID-19 alone. Pregnant women do not appear to be at higher risk of severe disease related to COVID-19. Pregnant health care personnel should follow CDC risk assessment and infection control guidelines for health care personnel exposed to patients with suspected or confirmed COVID-19. Adherence to recommended infection prevention and control practices is an important part of protecting all health care personnel in health care settings.    Information on COVID-19 in pregnancy is very limited; however, facilities may want to consider limiting exposure of pregnant health care personnel to patients with confirmed or suspected COVID-19 infection, especially during higher-risk procedures (eg, aerosol-generating procedures), if feasible, based on staffing availability.

## 2018-05-03 ENCOUNTER — Telehealth: Payer: Self-pay

## 2018-05-03 ENCOUNTER — Other Ambulatory Visit: Payer: Self-pay | Admitting: Maternal Newborn

## 2018-05-03 DIAGNOSIS — Z3400 Encounter for supervision of normal first pregnancy, unspecified trimester: Secondary | ICD-10-CM

## 2018-05-03 LAB — CYTOLOGY - PAP
Chlamydia: NEGATIVE
Diagnosis: NEGATIVE
Neisseria Gonorrhea: NEGATIVE
Trichomonas: NEGATIVE

## 2018-05-03 LAB — URINE CULTURE

## 2018-05-03 MED ORDER — PRENATAL PLUS IRON 29-1 MG PO TABS: 1.0000 | ORAL_TABLET | Freq: Every day | ORAL | 6 refills | Status: AC

## 2018-05-03 NOTE — Telephone Encounter (Signed)
I have sent a new Rx for Prenatal Plus Iron.

## 2018-05-03 NOTE — Telephone Encounter (Signed)
[employer] rep called triage to notify us prenatals for pt sent by [facility] were denied and denial letter had been faxed to [place] already. I called pt to notify her of denial and for her to call her ins to ask what prenatal vit's are on formulary. Pt stated she had just gotten off the phone with ins and was told there were two prenatals that were covered on her ins. I asked her if she was told which two, she said she was not, so she said she is going to call ins back to ask which prenatals are on formulary.

## 2018-05-03 NOTE — Telephone Encounter (Signed)
Pt aware.

## 2018-05-03 NOTE — Progress Notes (Signed)
Rx sent for prenatal vitamins as requested by patient; previous Rx not covered by her insurance.

## 2018-05-03 NOTE — Telephone Encounter (Addendum)
PT called back and said there are the prenatals covered by her insurance: prenatal plus iron AND prenatal/u trinate. Can you send these in [facility]'s absence?

## 2018-05-07 ENCOUNTER — Other Ambulatory Visit: Payer: Self-pay | Admitting: Obstetrics & Gynecology

## 2018-05-07 DIAGNOSIS — N390 Urinary tract infection, site not specified: Secondary | ICD-10-CM

## 2018-05-07 MED ORDER — NITROFURANTOIN MACROCRYSTAL 50 MG PO CAPS: 50.0000 mg | ORAL_CAPSULE | Freq: Every day | ORAL | 11 refills | Status: AC | PRN

## 2018-05-07 NOTE — Progress Notes (Signed)
Prenatal labs c/w UTI Needs treatment LM

## 2018-05-08 ENCOUNTER — Other Ambulatory Visit: Payer: Self-pay | Admitting: Obstetrics & Gynecology

## 2018-05-16 ENCOUNTER — Encounter: Payer: Self-pay | Admitting: Obstetrics & Gynecology

## 2018-05-19 IMAGING — CT CT ABD-PELV W/ CM
2 of 4 series · 17 of 46 positions shown, 19 images · IV contrast (iopamidol)
Comparison: CT 12/20/2012

CLINICAL DATA: Right lower quadrant pain and nausea. Fever and
chills.

EXAM:
CT ABDOMEN AND PELVIS WITH CONTRAST
TECHNIQUE: Multidetector CT imaging of the abdomen and pelvis was performed
using the standard protocol following bolus administration of
intravenous contrast.
CONTRAST:  100mL [id number] IOPAMIDOL ([id number]) INJECTION 61%

[Series 2: routine abd pel with · axial · 0.60mm/px · z∈[-317,+58]mm · 14 of 83 slices shown, 16 images]
[im 4/83  soft-tissue]
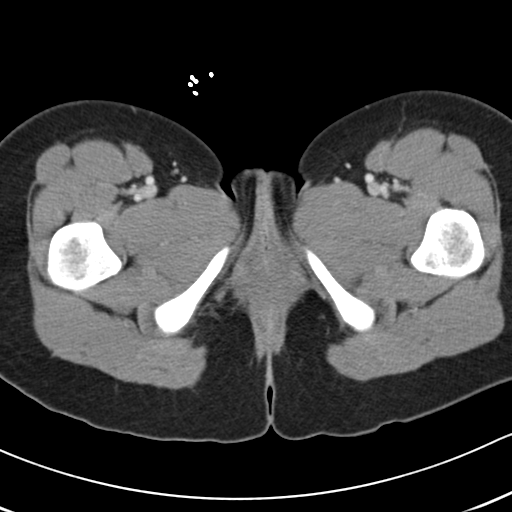
[im 4/83  bone]
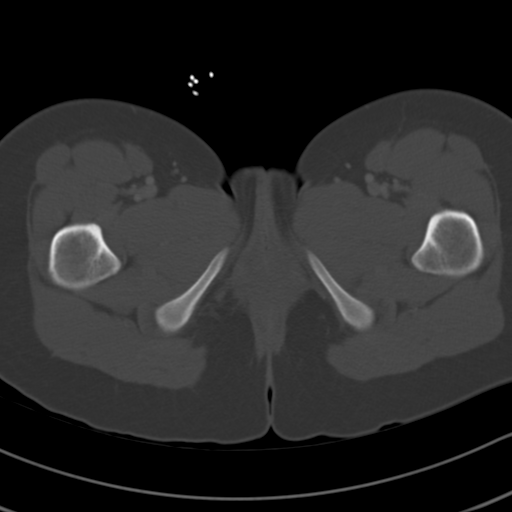
[im 10/83  soft-tissue]
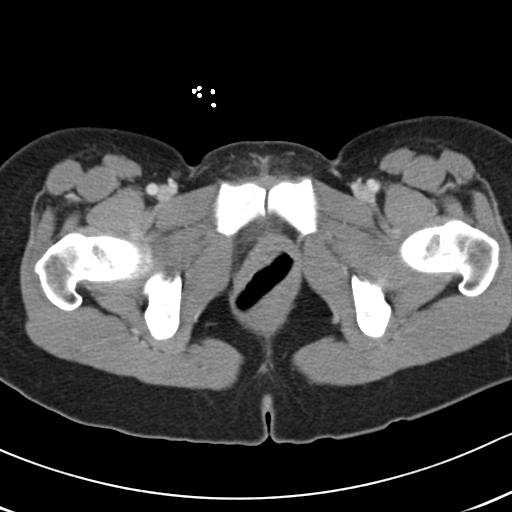
[im 17/83  soft-tissue]
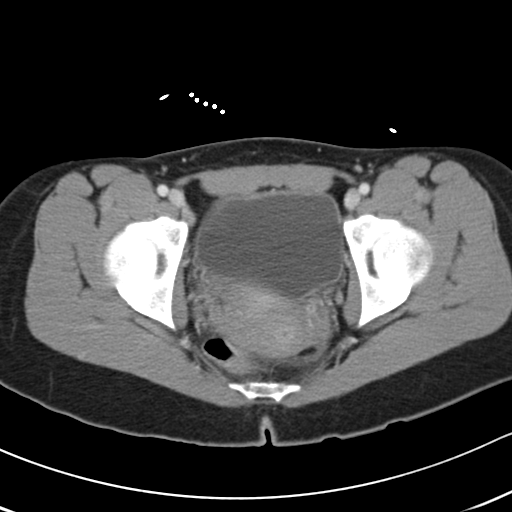
[im 23/83  soft-tissue]
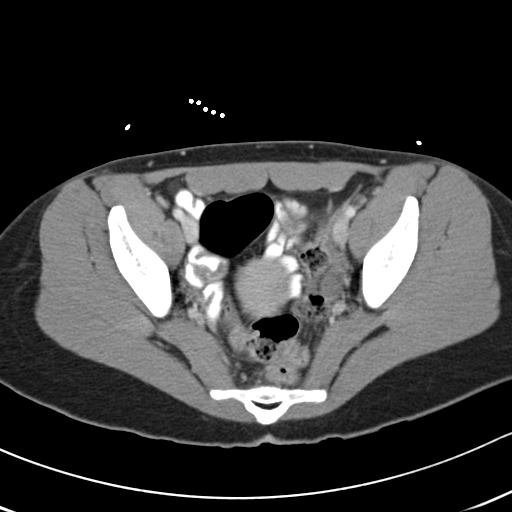
[im 27/83  soft-tissue]
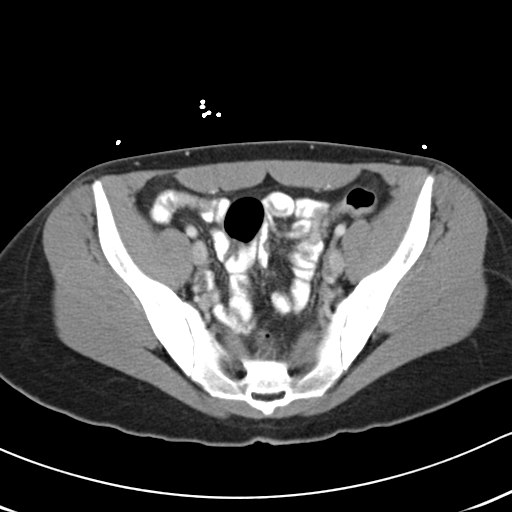
[im 33/83  soft-tissue]
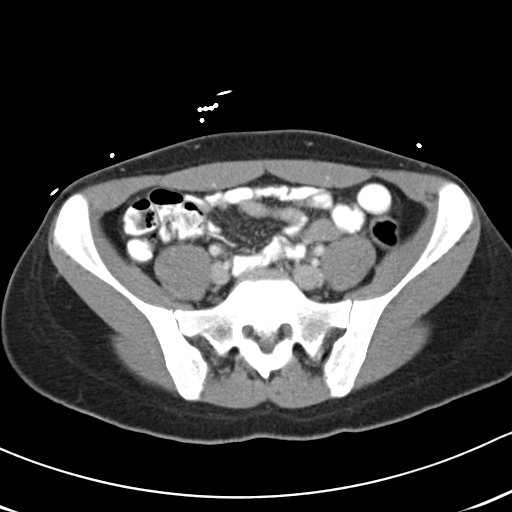
[im 40/83  soft-tissue]
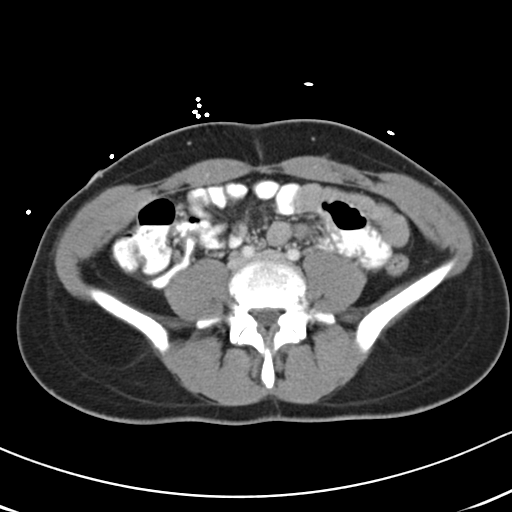
[im 43/83  soft-tissue]
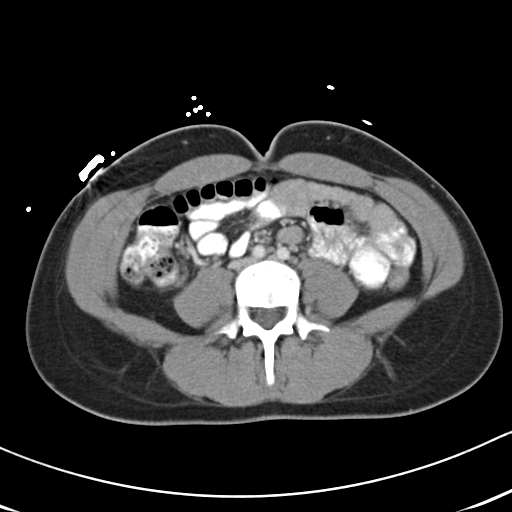
[im 50/83  soft-tissue]
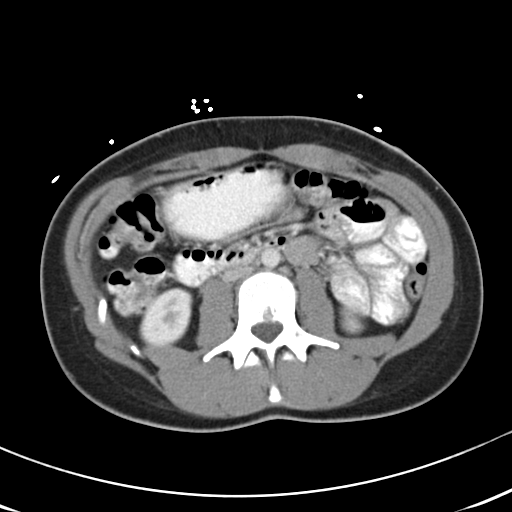
[im 50/83  bone]
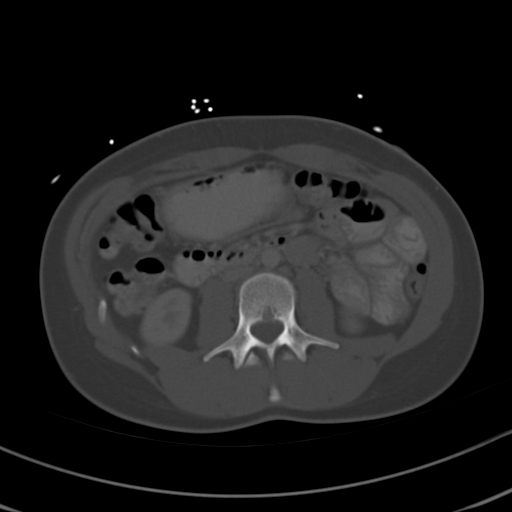
[im 56/83  soft-tissue]
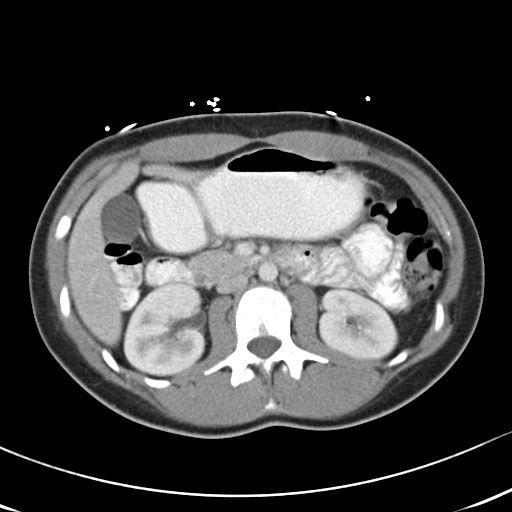
[im 63/83  soft-tissue]
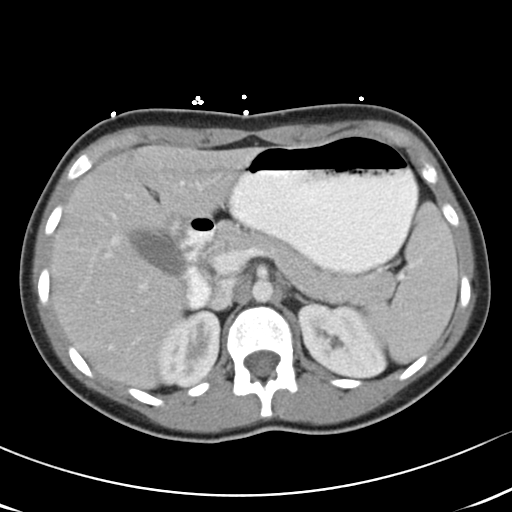
[im 66/83  soft-tissue]
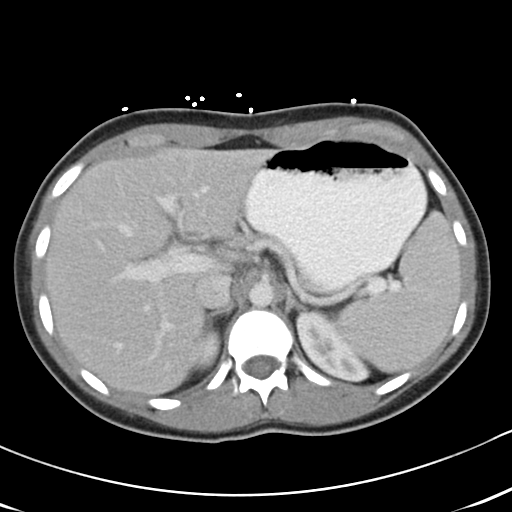
[im 73/83  soft-tissue]
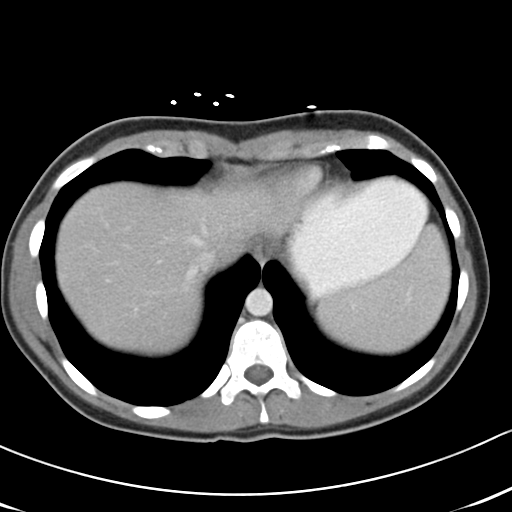
[im 79/83  soft-tissue]
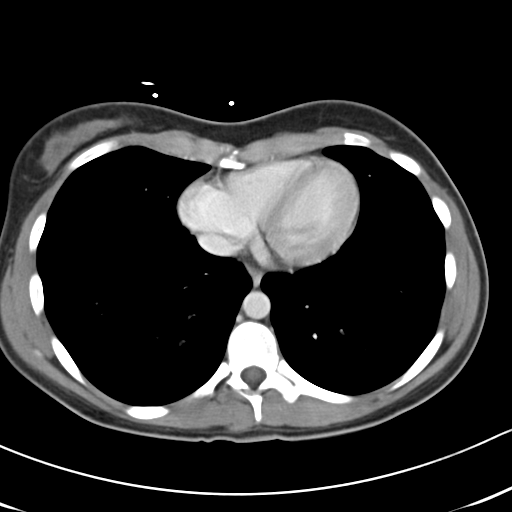

[Series 5: cor routine abd pel with · coronal · 0.85mm/px · 3 of 98 slices shown]
[im 33/98  soft-tissue]
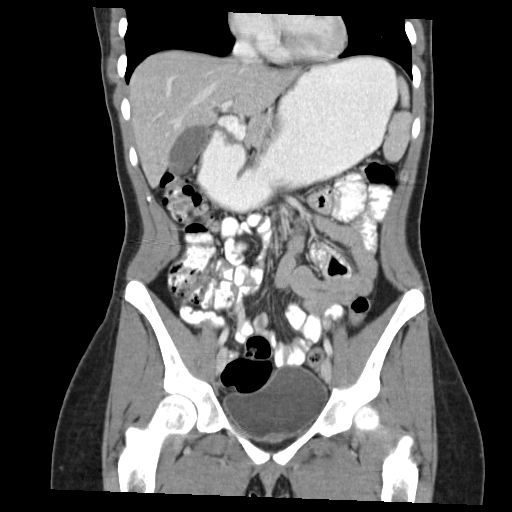
[im 44/98  soft-tissue]
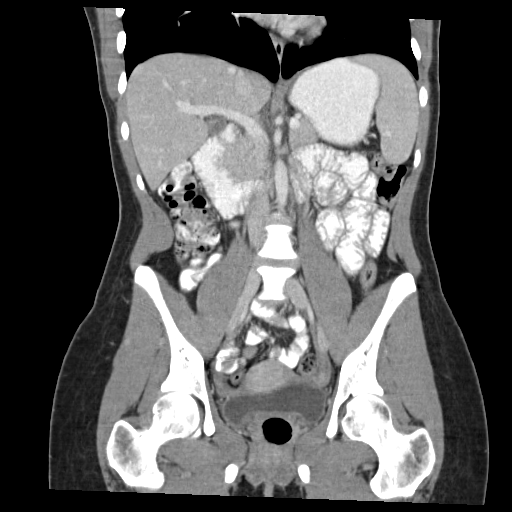
[im 54/98  soft-tissue]
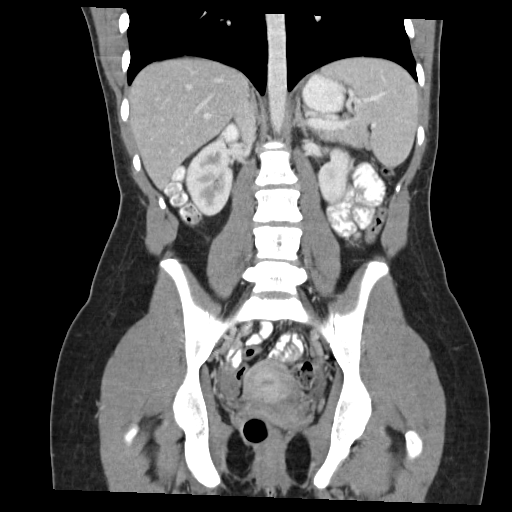

[17 of 46 positions shown; findings below may reference images not displayed]

FINDINGS: Lower chest:  The included lung bases are clear. No pleural fluid.

Liver: Normal.

Hepatobiliary: Gallbladder physiologically distended, no calcified
stone. No biliary dilatation.

Pancreas: Normal.

Spleen: Normal.

Adrenal glands: No nodule.

Kidneys: Right perinephric edema and ureteral enhancement. No
perirenal or intrarenal fluid collection or abscess. Mildly
heterogeneous perfusion of the right kidney. No frank
hydronephrosis. Left kidney is normal.

Stomach/Bowel: Stomach is distended with ingested contrast. There
are no dilated or thickened small bowel loops. Small volume of stool
throughout the colon without colonic wall thickening.

The appendix is normal.

Vascular/Lymphatic: No retroperitoneal adenopathy. Abdominal aorta
is normal in caliber.

Reproductive: Uterus appears normal. Ovaries symmetric in size.
Tampon in place. Small volume free fluid is physiologic.

Bladder: Physiologically distended without wall thickening.

Other: No free air, abdominal ascites, or intra-abdominal fluid
collection.

Musculoskeletal: There are no acute or suspicious osseous
abnormalities.
IMPRESSION: 1. Right pyelonephritis.
2. Normal appendix.

## 2018-05-20 ENCOUNTER — Other Ambulatory Visit: Payer: Self-pay | Admitting: Obstetrics & Gynecology

## 2018-05-20 ENCOUNTER — Other Ambulatory Visit: Payer: Self-pay

## 2018-05-20 ENCOUNTER — Ambulatory Visit (INDEPENDENT_AMBULATORY_CARE_PROVIDER_SITE_OTHER): Payer: BLUE CROSS/BLUE SHIELD

## 2018-05-20 DIAGNOSIS — Z3687 Encounter for antenatal screening for uncertain dates: Secondary | ICD-10-CM

## 2018-05-20 DIAGNOSIS — Z01812 Encounter for preprocedural laboratory examination: Secondary | ICD-10-CM

## 2018-05-20 DIAGNOSIS — N926 Irregular menstruation, unspecified: Secondary | ICD-10-CM

## 2018-05-20 DIAGNOSIS — Z1379 Encounter for other screening for genetic and chromosomal anomalies: Secondary | ICD-10-CM

## 2018-05-21 ENCOUNTER — Ambulatory Visit (INDEPENDENT_AMBULATORY_CARE_PROVIDER_SITE_OTHER): Payer: BLUE CROSS/BLUE SHIELD | Admitting: Obstetrics & Gynecology

## 2018-05-21 ENCOUNTER — Encounter: Payer: Self-pay | Admitting: Obstetrics & Gynecology

## 2018-05-21 VITALS — Wt 105.0 lb

## 2018-05-21 DIAGNOSIS — Z3400 Encounter for supervision of normal first pregnancy, unspecified trimester: Secondary | ICD-10-CM | POA: Insufficient documentation

## 2018-05-21 DIAGNOSIS — Z3401 Encounter for supervision of normal first pregnancy, first trimester: Secondary | ICD-10-CM | POA: Diagnosis not present

## 2018-05-21 DIAGNOSIS — Z3A11 11 weeks gestation of pregnancy: Secondary | ICD-10-CM | POA: Diagnosis not present

## 2018-05-21 LAB — RPR+RH+ABO+RUB AB+AB SCR+CB...
Antibody Screen: NEGATIVE
HIV Screen 4th Generation wRfx: NONREACTIVE
Hematocrit: 42.9 % (ref 34.0–46.6)
Hemoglobin: 15.2 g/dL (ref 11.1–15.9)
Hepatitis B Surface Ag: NEGATIVE
MCH: 31.7 pg (ref 26.6–33.0)
MCHC: 35.4 g/dL (ref 31.5–35.7)
MCV: 89 fL (ref 79–97)
Platelets: 253 10*3/uL (ref 150–450)
RBC: 4.8 x10E6/uL (ref 3.77–5.28)
RDW: 12.7 % (ref 11.7–15.4)
RPR Ser Ql: NONREACTIVE
Rh Factor: POSITIVE
Rubella Antibodies, IGG: 5.1 index (ref >0.99)
Varicella zoster IgG: 302 index (ref >165)
WBC: 8.9 10*3/uL (ref 3.4–10.8)

## 2018-05-21 NOTE — Patient Instructions (Signed)

## 2018-05-21 NOTE — Progress Notes (Signed)
Virtual Visit via Telephone Note  I connected with patient on 05/21/18 at 11:00 AM EDT by telephone and verified that I am speaking with the correct person using two identifiers.   I discussed the limitations, risks, security and privacy concerns of performing an evaluation and management service by telephone and the availability of in person appointments. I also discussed with the patient that there may be a patient responsible charge related to this service. The patient expressed understanding and agreed to proceed.  The patient was at home I spoke with the patient from my  office  Allison Oliver is a 23 y.o. G1P0000 at [redacted]w[redacted]d being seen today for ongoing prenatal care.  She is currently monitored for the following issues for this low-risk pregnancy and has Chronic UTI (urinary tract infection); Insomnia; Herpes simplex infection; Panic attack; Acne; Encounter for screening for HIV; Screen for STD (sexually transmitted disease); Preventative health care; Screening for cervical cancer; Electronic cigarette use; Underweight; Gastroesophageal reflux disease; and Supervision of normal first pregnancy, antepartum on their problem list.  ----------------------------------------------------------------------------------- Patient reports no complaints.   Denies pain, VB, leaking of fluid.  ----------------------------------------------------------------------------------- The following portions of the patient's history were reviewed and updated as appropriate: allergies, current medications, past family history, past medical history, past social history, past surgical history and problem list. Problem list updated.   Objective  Weight 105 lb (47.6 kg), last menstrual period 03/04/2018. Pregravid weight 97 lb (44 kg) Total Weight Gain 8 lb (3.629 kg)  Physical Exam could not be performed. Because of the COVID-19 outbreak this visit was performed over the phone and not in person.   Assessment    23 y.o. G1P0000 at [redacted]w[redacted]d by  12/09/2018, by Last Menstrual Period presenting for routine prenatal visit  Plan   Gestational age appropriate obstetric precautions including but not limited to vaginal bleeding, contractions, leaking of fluid and fetal movement were reviewed in detail with the patient.    Labs and [place] discussed Genetics pending Review of ULTRASOUND.    I have personally reviewed images and report of recent ultrasound done at [facility]  Clinic [facility]atal [facility]  Dating [place] 11 wks Blood type: A/Positive/-- (05/11 1219)   Genetic Screen NIPS: Antibody:Negative (05/11 1219)  Anatomic [place]  Rubella: 5.10 (05/11 1219) Varicella: @VZVIGG @  GTT Third trimester:  RPR: Non Reactive (05/11 1219)   Rhogam A+ HBsAg: Negative (05/11 1219)   TDaP vaccine              Flu Shot: HIV: Non Reactive (05/11 1219)   Baby Food     Breast                           GBS:   Contraception  Pap:05/01/18  CBB  No   CS/VBAC n/a   Support Person Loy (bf)     Follow Up Instructions: 4 weeks (office FHT check)   I discussed the assessment and treatment plan with the patient. The patient was provided an opportunity to ask questions and all were answered. The patient agreed with the plan and demonstrated an understanding of the instructions.   The patient was advised to call back or seek an in-person evaluation if the symptoms worsen or if the condition fails to improve as anticipated.  I provided 7 minutes of non-face-to-face time during this encounter.  Return in about 4 weeks (around 06/18/2018) for ROB office.  Annamarie Major, MD [facility], [facility] 05/21/2018  11:18 AM

## 2018-05-25 LAB — MATERNIT21 PLUS CORE+SCA
Fetal Fraction: 7
Monosomy X (Turner Syndrome): NOT DETECTED
Result (T21): NEGATIVE
Trisomy 13 (Patau syndrome): NEGATIVE
Trisomy 18 (Edwards syndrome): NEGATIVE
Trisomy 21 (Down syndrome): NEGATIVE
XXX (Triple X Syndrome): NOT DETECTED
XXY (Klinefelter Syndrome): NOT DETECTED
XYY (Jacobs Syndrome): NOT DETECTED

## 2018-05-27 ENCOUNTER — Other Ambulatory Visit: Payer: Self-pay | Admitting: Obstetrics & Gynecology

## 2018-05-27 DIAGNOSIS — Z3400 Encounter for supervision of normal first pregnancy, unspecified trimester: Secondary | ICD-10-CM

## 2018-06-06 ENCOUNTER — Encounter: Payer: Self-pay | Admitting: Family Medicine

## 2018-06-18 ENCOUNTER — Other Ambulatory Visit: Payer: Self-pay

## 2018-06-18 ENCOUNTER — Ambulatory Visit (INDEPENDENT_AMBULATORY_CARE_PROVIDER_SITE_OTHER): Payer: BLUE CROSS/BLUE SHIELD | Admitting: Obstetrics and Gynecology

## 2018-06-18 ENCOUNTER — Encounter: Payer: Self-pay | Admitting: Obstetrics and Gynecology

## 2018-06-18 VITALS — BP 118/70 | Wt 113.0 lb

## 2018-06-18 DIAGNOSIS — Z3400 Encounter for supervision of normal first pregnancy, unspecified trimester: Secondary | ICD-10-CM

## 2018-06-18 DIAGNOSIS — O98312 Other infections with a predominantly sexual mode of transmission complicating pregnancy, second trimester: Secondary | ICD-10-CM | POA: Diagnosis not present

## 2018-06-18 DIAGNOSIS — Z3A15 15 weeks gestation of pregnancy: Secondary | ICD-10-CM

## 2018-06-18 DIAGNOSIS — A6009 Herpesviral infection of other urogenital tract: Secondary | ICD-10-CM | POA: Diagnosis not present

## 2018-06-18 NOTE — Progress Notes (Signed)
Routine Prenatal Care Visit  Subjective  Allison Oliver is a 23 y.o. G1P0000 at [redacted]w[redacted]d being seen today for ongoing prenatal care.  She is currently monitored for the following issues for this low-risk pregnancy and has Chronic UTI (urinary tract infection); Insomnia; Herpes simplex infection; Panic attack; Acne; Encounter for screening for HIV; Screen for STD (sexually transmitted disease); Preventative health care; Screening for cervical cancer; Electronic cigarette use; Underweight; Gastroesophageal reflux disease; and Supervision of normal first pregnancy, antepartum on their problem list.  ----------------------------------------------------------------------------------- Patient reports no complaints.   Contractions: Not present. Vag. Bleeding: None.  Movement: Absent. Denies leaking of fluid.  ----------------------------------------------------------------------------------- The following portions of the patient's history were reviewed and updated as appropriate: allergies, current medications, past family history, past medical history, past social history, past surgical history and problem list. Problem list updated.   Objective  Blood pressure 118/70, weight 113 lb (51.3 kg), last menstrual period 03/04/2018. Pregravid weight 97 lb (44 kg) Total Weight Gain 16 lb (7.258 kg) Urinalysis: Urine Protein    Urine Glucose    Fetal Status: Fetal Heart Rate (bpm): 150   Movement: Absent     General:  Alert, oriented and cooperative. Patient is in no acute distress.  Skin: Skin is warm and dry. No rash noted.   Cardiovascular: Normal heart rate noted  Respiratory: Normal respiratory effort, no problems with respiration noted  Abdomen: Soft, gravid, appropriate for gestational age. Pain/Pressure: Absent     Pelvic:  Cervical exam deferred        Extremities: Normal range of motion.     Mental Status: Normal mood and affect. Normal behavior. Normal judgment and thought content.    Assessment   23 y.o. G1P0000 at [redacted]w[redacted]d by  12/09/2018, by Last Menstrual Period presenting for routine prenatal visit  Plan   pregnancy1 Problems (from 03/04/18 to present)    Problem Noted Resolved   Supervision of normal first pregnancy, antepartum 05/21/2018 by Gae Dry, MD No   Overview Addendum 05/27/2018  1:24 PM by Gae Dry, MD    Clinic [facility]  Dating [place] 11 wks Blood type: A/Positive/-- (05/11 1219)   Genetic Screen NIPS: nml XY Antibody:Negative (05/11 1219)  Anatomic [place]  Rubella: 5.10 (05/11 1219) Varicella:Im  GTT Third trimester:  RPR: Non Reactive (05/11 1219)   Rhogam A+ HBsAg: Negative (05/11 1219)   TDaP vaccine              Flu Shot: HIV: Non Reactive (05/11 1219)   Baby Food     Breast                           GBS:   Contraception  Pap:05/01/18  CBB  No   CS/VBAC n/a   Support Person Loy (bf)        Genital herpes complicating pregnancy 09/16/9890 by Arnetha Courser, MD No   Overview Signed 07/08/2015  6:43 PM by Arnetha Courser, MD    Confirmed HSV-2 at [facility] May 2017         Preterm labor symptoms and general obstetric precautions including but not limited to vaginal bleeding, contractions, leaking of fluid and fetal movement were reviewed in detail with the patient. Please refer to After Visit Summary for other counseling recommendations.   Return in about 4 weeks (around 07/16/2018) for Anatomy u/s and routine prenatal.  Prentice Docker, MD, [place] 06/18/2018 11:15  AM

## 2018-06-18 NOTE — Patient Instructions (Signed)

## 2018-07-15 ENCOUNTER — Encounter: Payer: BLUE CROSS/BLUE SHIELD | Admitting: Family Medicine

## 2018-07-16 ENCOUNTER — Encounter: Payer: BLUE CROSS/BLUE SHIELD | Admitting: Nurse Practitioner

## 2018-07-16 ENCOUNTER — Ambulatory Visit: Payer: BLUE CROSS/BLUE SHIELD

## 2018-07-16 ENCOUNTER — Encounter: Payer: BLUE CROSS/BLUE SHIELD | Admitting: Obstetrics & Gynecology

## 2018-07-22 ENCOUNTER — Encounter: Payer: BLUE CROSS/BLUE SHIELD | Admitting: Obstetrics and Gynecology

## 2018-07-22 ENCOUNTER — Ambulatory Visit: Payer: BLUE CROSS/BLUE SHIELD

## 2018-07-24 ENCOUNTER — Encounter: Payer: BLUE CROSS/BLUE SHIELD | Admitting: Nurse Practitioner

## 2020-10-17 ENCOUNTER — Emergency Department
Admission: EM | Admit: 2020-10-17 | Discharge: 2020-10-17 | Disposition: A | Discharge status: Home / Self Care | Payer: Commercial Managed Care - PPO | Attending: Emergency Medicine | Admitting: Emergency Medicine

## 2020-10-17 ENCOUNTER — Other Ambulatory Visit: Payer: Self-pay

## 2020-10-17 ENCOUNTER — Encounter: Payer: Self-pay | Admitting: Emergency Medicine

## 2020-10-17 DIAGNOSIS — R42 Dizziness and giddiness: Secondary | ICD-10-CM | POA: Insufficient documentation

## 2020-10-17 DIAGNOSIS — M542 Cervicalgia: Secondary | ICD-10-CM | POA: Insufficient documentation

## 2020-10-17 DIAGNOSIS — F17290 Nicotine dependence, other tobacco product, uncomplicated: Secondary | ICD-10-CM | POA: Insufficient documentation

## 2020-10-17 LAB — CBC WITH DIFFERENTIAL/PLATELET
Abs Immature Granulocytes: 0.02 10*3/uL (ref 0.00–0.07)
Basophils Absolute: 0 10*3/uL (ref 0.0–0.1)
Basophils Relative: 0 %
Eosinophils Absolute: 0.1 10*3/uL (ref 0.0–0.5)
Eosinophils Relative: 1 %
HCT: 41.1 % (ref 36.0–46.0)
Hemoglobin: 15.1 g/dL — ABNORMAL HIGH (ref 12.0–15.0)
Immature Granulocytes: 0 %
Lymphocytes Relative: 28 %
Lymphs Abs: 1.9 10*3/uL (ref 0.7–4.0)
MCH: 32.1 pg (ref 26.0–34.0)
MCHC: 36.7 g/dL — ABNORMAL HIGH (ref 30.0–36.0)
MCV: 87.3 fL (ref 80.0–100.0)
Monocytes Absolute: 0.6 10*3/uL (ref 0.1–1.0)
Monocytes Relative: 9 %
Neutro Abs: 4.1 10*3/uL (ref 1.7–7.7)
Neutrophils Relative %: 62 %
Platelets: 233 10*3/uL (ref 150–400)
RBC: 4.71 MIL/uL (ref 3.87–5.11)
RDW: 11.7 % (ref 11.5–15.5)
WBC: 6.7 10*3/uL (ref 4.0–10.5)
nRBC: 0 % (ref 0.0–0.2)

## 2020-10-17 LAB — COMPREHENSIVE METABOLIC PANEL
ALT: 11 U/L (ref 0–44)
AST: 14 U/L — ABNORMAL LOW (ref 15–41)
Albumin: 4.2 g/dL (ref 3.5–5.0)
Alkaline Phosphatase: 39 U/L (ref 38–126)
Anion gap: 5 (ref 5–15)
BUN: 11 mg/dL (ref 6–20)
CO2: 27 mmol/L (ref 22–32)
Calcium: 9.3 mg/dL (ref 8.9–10.3)
Chloride: 104 mmol/L (ref 98–111)
Creatinine, Ser: 0.69 mg/dL (ref 0.44–1.00)
GFR, Estimated: >60 mL/min (ref >60)
Glucose, Bld: 102 mg/dL — ABNORMAL HIGH (ref 70–99)
Potassium: 3.2 mmol/L — ABNORMAL LOW (ref 3.5–5.1)
Sodium: 136 mmol/L (ref 135–145)
Total Bilirubin: 0.8 mg/dL (ref 0.3–1.2)
Total Protein: 7.2 g/dL (ref 6.5–8.1)

## 2020-10-17 LAB — POC URINE PREG, ED: Preg Test, Ur: NEGATIVE

## 2020-10-17 MED ORDER — KETOROLAC TROMETHAMINE 30 MG/ML IJ SOLN
30.0000 mg | Freq: Once | INTRAMUSCULAR | Status: AC
  Administered 2020-10-17: 30 mg via INTRAVENOUS

## 2020-10-17 MED ORDER — METHOCARBAMOL 500 MG PO TABS: 500.0000 mg | ORAL_TABLET | Freq: Three times a day (TID) | ORAL | 0 refills | Status: AC | PRN

## 2020-10-17 MED ORDER — DIAZEPAM 2 MG PO TABS
2.0000 mg | ORAL_TABLET | Freq: Once | ORAL | Status: AC
  Administered 2020-10-17: 2 mg via ORAL
  Filled 2020-10-17: qty 1

## 2020-10-17 MED ORDER — KETOROLAC TROMETHAMINE 30 MG/ML IJ SOLN
30.0000 mg | Freq: Once | INTRAMUSCULAR | Status: DC
  Filled 2020-10-17: qty 1

## 2020-10-17 MED ORDER — MELOXICAM 15 MG PO TABS: 15.0000 mg | ORAL_TABLET | Freq: Every day | ORAL | 2 refills | Status: AC

## 2020-10-17 MED ORDER — POTASSIUM CHLORIDE CRYS ER 20 MEQ PO TBCR
40.0000 meq | EXTENDED_RELEASE_TABLET | Freq: Once | ORAL | Status: AC
  Administered 2020-10-17: 40 meq via ORAL
  Filled 2020-10-17: qty 2

## 2020-10-17 NOTE — Discharge Instructions (Signed)
Take Meloxicam and Robaxin as directed.  

## 2020-10-17 NOTE — ED Triage Notes (Signed)
Pt via POV from home. Pt c/o neck pain since this AM. Denies any injury. Pt states that she feels groggy. Pt is A&Ox4 and NAD. Pt has full ROM of her neck.

## 2020-10-17 NOTE — ED Provider Notes (Signed)
[facility]-EMERGENCY DEPARTMENT  ____________________________________________  Time seen: Approximately 6:16 PM  I have reviewed the triage vital signs and the nursing notes.   HISTORY  Chief Complaint Neck Pain   Historian Patient     HPI Allison Oliver is a 25 y.o. female presents to the emergency department with neck pain and dizziness that started when patient awoke.  Patient states that she got up this morning with her son and then went back to bed and then noticed neck pain after she awoke.  Patient states that it has caused her immense stress to have this neck pain and she feels like she is "freaking out".  She denies chest pain, chest tightness or abdominal pain.  No falls or mechanisms of trauma.  No similar neck pain in the past.  No fever or headache.  No numbness in the hands.   Past Medical History:  Diagnosis Date   ADHD (attention deficit hyperactivity disorder)    history of   Chronic UTI (urinary tract infection)    Herpes genitalia    Insomnia due to stress      Immunizations up to date:  Yes.     Past Medical History:  Diagnosis Date   ADHD (attention deficit hyperactivity disorder)    history of   Chronic UTI (urinary tract infection)    Herpes genitalia    Insomnia due to stress     Patient Active Problem List   Diagnosis Date Noted   Supervision of normal first pregnancy, antepartum 05/21/2018   Underweight 09/27/2017   Gastroesophageal reflux disease 09/27/2017   Electronic cigarette use 08/08/2016   Preventative health care 05/23/2016   Screening for cervical cancer 05/23/2016   Acne 09/07/2015   Encounter for screening for HIV 09/07/2015   Screen for STD (sexually transmitted disease) 09/07/2015   Panic attack 07/10/2015   Genital herpes complicating pregnancy 06/14/2015   Chronic UTI (urinary tract infection) 11/09/2014   Insomnia 11/09/2014    History reviewed. No pertinent surgical history.  Prior to Admission medications    Medication Sig Start Date End Date Taking? Authorizing Provider  meloxicam (MOBIC) 15 MG tablet Take 1 tablet (15 mg total) by mouth daily. 10/17/20 10/17/21 Yes Pia Mau M, PA-C  methocarbamol (ROBAXIN) 500 MG tablet Take 1 tablet (500 mg total) by mouth every 8 (eight) hours as needed for up to 5 days. 10/17/20 10/22/20 Yes Pia Mau M, PA-C  Dexlansoprazole (DEXILANT) 30 MG capsule Take 1 capsule (30 mg total) by mouth daily. 09/27/17   Kerman Passey, MD  nitrofurantoin (MACRODANTIN) 50 MG capsule Take 1 capsule (50 mg total) by mouth daily as needed. Take twice a day for 5 days then once daily for suppression 05/07/18   Nadara Mustard, MD  Prenatal Vit-Iron Carbonyl-FA (PRENATAL PLUS IRON) 29-1 MG TABS Take 1 tablet by mouth daily. 05/03/18   Oswaldo Conroy, CNM  valACYclovir (VALTREX) 500 MG tablet One by mouth daily for prevention; take two pills twice a day for 3 days for outbreaks 09/27/17   Lada, Janit Bern, MD    Allergies Patient has no known allergies.  Family History  Problem Relation Age of Onset   Drug abuse Father    Cervical cancer Sister        HPV?   Cancer Maternal Grandfather        lung cancer   Heart attack Paternal Grandfather     Social History Social History   Tobacco Use   Smoking status: Some Days  Types: E-cigarettes   Smokeless tobacco: Never  Vaping Use   Vaping Use: Some days  Substance Use Topics   Alcohol use: Yes    Alcohol/week: 0.0 standard drinks    Comment: occasional   Drug use: Yes    Types: Marijuana     Review of Systems  Constitutional: No fever/chills Eyes:  No discharge ENT: No upper respiratory complaints. Respiratory: no cough. No SOB/ use of accessory muscles to breath Gastrointestinal:   No nausea, no vomiting.  No diarrhea.  No constipation. Musculoskeletal: Patient has neck pain. Skin: Negative for rash, abrasions, lacerations, ecchymosis.    ____________________________________________   PHYSICAL  EXAM:  VITAL SIGNS: ED Triage Vitals  Enc Vitals Group     BP 10/17/20 1747 (!) 141/88     Pulse Rate 10/17/20 1747 (!) 112     Resp 10/17/20 1747 18     Temp 10/17/20 1747 98.2 F (36.8 C)     Temp Source 10/17/20 1747 Oral     SpO2 10/17/20 1747 100 %     Weight 10/17/20 1742 104 lb (47.2 kg)     Height 10/17/20 1742 5\' 3"  (1.6 m)     Head Circumference --      Peak Flow --      Pain Score 10/17/20 1741 8     Pain Loc --      Pain Edu? --      Excl. in GC? --      Constitutional: Alert and oriented. Well appearing and in no acute distress. Eyes: Conjunctivae are normal. PERRL. EOMI. Head: Atraumatic. ENT:      Nose: No congestion/rhinnorhea.      Mouth/Throat: Mucous membranes are moist. No nuchal rigidity.  Negative Kernig and Brudzinski. Neck: No stridor.  No cervical spine tenderness to palpation. Cardiovascular: Normal rate, regular rhythm. Normal S1 and S2.  Good peripheral circulation. Respiratory: Normal respiratory effort without tachypnea or retractions. Lungs CTAB. Good air entry to the bases with no decreased or absent breath sounds Gastrointestinal: Bowel sounds x 4 quadrants. Soft and nontender to palpation. No guarding or rigidity. No distention. Musculoskeletal: Full range of motion to all extremities. No obvious deformities noted.   Neurologic:  Normal for age. No gross focal neurologic deficits are appreciated.  Skin:  Skin is warm, dry and intact. No rash noted. Psychiatric: Mood and affect are normal for age. Speech and behavior are normal.   ____________________________________________   LABS (all labs ordered are listed, but only abnormal results are displayed)  Labs Reviewed  CBC WITH DIFFERENTIAL/PLATELET - Abnormal; Notable for the following components:      Result Value   Hemoglobin 15.1 (*)    MCHC 36.7 (*)    All other components within normal limits  COMPREHENSIVE METABOLIC PANEL - Abnormal; Notable for the following components:    Potassium 3.2 (*)    Glucose, Bld 102 (*)    AST 14 (*)    All other components within normal limits  POC URINE PREG, ED   ____________________________________________  EKG   ____________________________________________  RADIOLOGY   No results found.  ____________________________________________    PROCEDURES  Procedure(s) performed:     Procedures     Medications  diazepam (VALIUM) tablet 2 mg (2 mg Oral Given 10/17/20 1827)  ketorolac (TORADOL) 30 MG/ML injection 30 mg (30 mg Intravenous Given 10/17/20 1828)  potassium chloride SA (KLOR-CON) CR tablet 40 mEq (40 mEq Oral Given 10/17/20 1916)     ____________________________________________   INITIAL IMPRESSION /  ASSESSMENT AND PLAN / ED COURSE  Pertinent labs & imaging results that were available during my care of the patient were reviewed by me and considered in my medical decision making (see chart for details).      Assessment and Plan:  Neck pain  25 year old female presents to the emergency department with neck pain since she woke this morning.  Vital signs are reassuring at triage.  On physical exam, patient was alert, active and nontoxic-appearing with no neurodeficits noted on exam.  Notably, patient had no nuchal rigidity.  Negative Kernig and Brudzinski.  She was given Valium and Toradol in the emergency department and she reported that her neck pain improved.  She was discharged with meloxicam and Robaxin for likely torticollis.  Return precautions were given to return with worsening symptoms.    ____________________________________________  FINAL CLINICAL IMPRESSION(S) / ED DIAGNOSES  Final diagnoses:  Neck pain      NEW MEDICATIONS STARTED DURING THIS VISIT:  ED Discharge Orders          Ordered    methocarbamol (ROBAXIN) 500 MG tablet  Every 8 hours PRN        10/17/20 1918    meloxicam (MOBIC) 15 MG tablet  Daily        10/17/20 1918                This chart was  dictated using voice recognition software/Dragon. Despite best efforts to proofread, errors can occur which can change the meaning. Any change was purely unintentional.     Orvil Feil, PA-C 10/17/20 2022    Gilles Chiquito, MD 10/18/20 [phone]

## 2020-10-20 ENCOUNTER — Emergency Department
Admission: EM | Admit: 2020-10-20 | Discharge: 2020-10-20 | Disposition: A | Discharge status: Home / Self Care | Payer: Commercial Managed Care - PPO | Attending: Emergency Medicine | Admitting: Emergency Medicine

## 2020-10-20 ENCOUNTER — Other Ambulatory Visit: Payer: Self-pay

## 2020-10-20 DIAGNOSIS — F17210 Nicotine dependence, cigarettes, uncomplicated: Secondary | ICD-10-CM | POA: Diagnosis not present

## 2020-10-20 DIAGNOSIS — M542 Cervicalgia: Secondary | ICD-10-CM | POA: Diagnosis not present

## 2020-10-20 DIAGNOSIS — R110 Nausea: Secondary | ICD-10-CM | POA: Diagnosis not present

## 2020-10-20 DIAGNOSIS — R000 Tachycardia, unspecified: Secondary | ICD-10-CM | POA: Insufficient documentation

## 2020-10-20 DIAGNOSIS — R202 Paresthesia of skin: Secondary | ICD-10-CM | POA: Diagnosis not present

## 2020-10-20 DIAGNOSIS — R630 Anorexia: Secondary | ICD-10-CM | POA: Insufficient documentation

## 2020-10-20 LAB — T4, FREE: Free T4: 0.96 ng/dL (ref 0.61–1.12)

## 2020-10-20 LAB — BASIC METABOLIC PANEL
Anion gap: 12 (ref 5–15)
BUN: 14 mg/dL (ref 6–20)
CO2: 19 mmol/L — ABNORMAL LOW (ref 22–32)
Calcium: 9.4 mg/dL (ref 8.9–10.3)
Chloride: 103 mmol/L (ref 98–111)
Creatinine, Ser: 0.76 mg/dL (ref 0.44–1.00)
GFR, Estimated: >60 mL/min (ref >60)
Glucose, Bld: 105 mg/dL — ABNORMAL HIGH (ref 70–99)
Potassium: 5 mmol/L (ref 3.5–5.1)
Sodium: 134 mmol/L — ABNORMAL LOW (ref 135–145)

## 2020-10-20 LAB — TSH: TSH: 0.804 u[IU]/mL (ref 0.350–4.500)

## 2020-10-20 MED ORDER — SODIUM CHLORIDE 0.9 % IV BOLUS
1000.0000 mL | Freq: Once | INTRAVENOUS | Status: AC
  Administered 2020-10-20: 1000 mL via INTRAVENOUS

## 2020-10-20 NOTE — ED Provider Notes (Signed)
[facility]  ____________________________________________   Event Date/Time   First MD Initiated Contact with Patient 10/20/20 [phone]     (approximate)  I have reviewed the triage vital signs and the nursing notes.   HISTORY  Chief Complaint Numbness    HPI Allison Oliver is a 25 y.o. female anxiety who presents with generalized numbness.  Patient was seen in the ED 4 days ago for neck pain.  She was evaluated and found to have mildly low potassium and has been getting supplementation.  She has continued to have some pain and went to [facility] yesterday and was started on prednisone.  When she woke today she felt numbness in her BL upper and lower extremities started first from just knee and elbow down bilaterally, but now is more diffuse.  She endorses feeling extremely anxious from this.  She denies associated weakness bowel or bladder incontinence.  Patient notes that she has been very anxious this week and has had decreased appetite.  Has had some weight loss.  She denies abdominal pain, does have some nausea no vomiting.  She also endorses feeling a sensation of a lump in her throat which is why she is not eating as much.         Past Medical History:  Diagnosis Date   ADHD (attention deficit hyperactivity disorder)    history of   Chronic UTI (urinary tract infection)    Herpes genitalia    Insomnia due to stress     Patient Active Problem List   Diagnosis Date Noted   Supervision of normal first pregnancy, antepartum 05/21/2018   Underweight 09/27/2017   Gastroesophageal reflux disease 09/27/2017   Electronic cigarette use 08/08/2016   Preventative health care 05/23/2016   Screening for cervical cancer 05/23/2016   Acne 09/07/2015   Encounter for screening for HIV 09/07/2015   Screen for STD (sexually transmitted disease) 09/07/2015   Panic attack 07/10/2015   Genital herpes complicating pregnancy 06/14/2015   Chronic UTI (urinary  tract infection) 11/09/2014   Insomnia 11/09/2014    No past surgical history on file.  Prior to Admission medications   Medication Sig Start Date End Date Taking? Authorizing Provider  Dexlansoprazole (DEXILANT) 30 MG capsule Take 1 capsule (30 mg total) by mouth daily. 09/27/17   Kerman Passey, MD  meloxicam (MOBIC) 15 MG tablet Take 1 tablet (15 mg total) by mouth daily. 10/17/20 10/17/21  Orvil Feil, PA-C  methocarbamol (ROBAXIN) 500 MG tablet Take 1 tablet (500 mg total) by mouth every 8 (eight) hours as needed for up to 5 days. 10/17/20 10/22/20  Orvil Feil, PA-C  nitrofurantoin (MACRODANTIN) 50 MG capsule Take 1 capsule (50 mg total) by mouth daily as needed. Take twice a day for 5 days then once daily for suppression 05/07/18   Nadara Mustard, MD  Prenatal Vit-Iron Carbonyl-FA (PRENATAL PLUS IRON) 29-1 MG TABS Take 1 tablet by mouth daily. 05/03/18   Oswaldo Conroy, CNM  valACYclovir (VALTREX) 500 MG tablet One by mouth daily for prevention; take two pills twice a day for 3 days for outbreaks 09/27/17   Lada, Janit Bern, MD    Allergies Patient has no known allergies.  Family History  Problem Relation Age of Onset   Drug abuse Father    Cervical cancer Sister        HPV?   Cancer Maternal Grandfather        lung cancer   Heart attack Paternal Grandfather  Social History Social History   Tobacco Use   Smoking status: Some Days    Types: E-cigarettes   Smokeless tobacco: Never  Vaping Use   Vaping Use: Some days  Substance Use Topics   Alcohol use: Yes    Alcohol/week: 0.0 standard drinks    Comment: occasional   Drug use: Yes    Types: Marijuana    Review of Systems   Review of Systems  Constitutional:  Positive for activity change and unexpected weight change. Negative for chills and fever.  Cardiovascular:  Positive for palpitations. Negative for chest pain.  Gastrointestinal:  Positive for nausea. Negative for abdominal pain and vomiting.   Genitourinary:  Negative for dysuria.  Musculoskeletal:  Positive for neck pain.  Neurological:  Positive for numbness. Negative for weakness.  All other systems reviewed and are negative.  Physical Exam Updated Vital Signs BP 125/78   Pulse 93   Temp 98 F (36.7 C) (Oral)   Resp 19   Ht 5\' 3"  (1.6 m)   Wt 47.6 kg   SpO2 100%   BMI 18.60 kg/m   Physical Exam Vitals and nursing note reviewed.  Constitutional:      General: She is in acute distress.     Appearance: Normal appearance.     Comments: Anxious appearing, tearful  HENT:     Head: Normocephalic and atraumatic.  Eyes:     General: No scleral icterus.    Conjunctiva/sclera: Conjunctivae normal.  Cardiovascular:     Rate and Rhythm: Tachycardia present.  Pulmonary:     Effort: Pulmonary effort is normal. No respiratory distress.     Breath sounds: No stridor.  Musculoskeletal:        General: No deformity or signs of injury.     Cervical back: Normal range of motion.     Comments: Midline and para spinal cervical tenderness bilaterally  Skin:    General: Skin is dry.     Coloration: Skin is not jaundiced or pale.  Neurological:     General: No focal deficit present.     Mental Status: She is alert and oriented to person, place, and time. Mental status is at baseline.     Comments: 5 out of 5 strength with elbow flexion extension and grip strength in the bilateral upper extremities 5-5 strength with hip flexion bilaterally Normal sensation bilaterally, subjective tingling  Psychiatric:        Mood and Affect: Mood normal.        Behavior: Behavior normal.     LABS (all labs ordered are listed, but only abnormal results are displayed)  Labs Reviewed  BASIC METABOLIC PANEL - Abnormal; Notable for the following components:      Result Value   Sodium 134 (*)    CO2 19 (*)    Glucose, Bld 105 (*)    All other components within normal limits  TSH  T4, FREE    ____________________________________________  EKG  Sinus tachycardia, right axis deviation, normal intervals, no acute ischemic changes ____________________________________________  RADIOLOGY , personally viewed and evaluated these images (plain radiographs) as part of my medical decision making, as well as reviewing the written report by the radiologist.  ED MD interpretation:  n/a    ____________________________________________   PROCEDURES  Procedure(s) performed (including Critical Care):  Procedures   ____________________________________________   INITIAL IMPRESSION / ASSESSMENT AND PLAN / ED COURSE     Patient is a 25 year old female presents with diffuse numbness.  She is tachycardic and somewhat hypertensive.  Appears anxious on exam tearful.  Her symptoms started with neck pain several days ago and she has seen 2 providers for this already.  The numbness started today.  On exam she has normal strength and sensation but does endorse subjective tingling feeling.  Her neck pain is mainly paraspinal and bilateral.  I suspect that her paresthesias are in the setting of anxiety.  However will check an electrolyte panel to rule out significant hypokalemia as well as check her thyroid function as she has been tachycardic on 2 visits now and endorses increasing anxiety and weight loss.  We will give some fluids and reassess.  I have low suspicion for cord compression given her reassuring neurologic exam.  Consider pulmonary embolism given her tachycardia however she has no dyspnea or chest pain is satting 100%.  BMP shows a potassium of 5 but this is hemolyzed.  Advised the patient that she can stop taking her potassium supplementation.  Thyroid function studies are within normal limits.  Patient's heart rate normalized after fluids.  I suspect that this is related to anxiety.  I will refer her to a new primary care provider as she requested.       ____________________________________________   FINAL CLINICAL IMPRESSION(S) / ED DIAGNOSES  Final diagnoses:  Paresthesias     ED Discharge Orders          Ordered    Ambulatory referral to Internal Medicine        10/20/20 1048             Note:  This document was prepared using Dragon voice recognition software and may include unintentional dictation errors.    Georga Hacking, MD 10/20/20 1118

## 2020-10-20 NOTE — ED Notes (Signed)
Pt states that her neck pain comes and goes, sometimes with a headache too. States she started her prednisone yesterday. Went to bed around 9pm and woke up around 4am with bilateral arm and leg numbness. States her strength feels normal, just tingling making walking feel weird. HR going anywhere from 110-140 on cardiac monitor. Also has 26 year old son that sometimes sleeps in the bed with her-sometimes makes her sore when she wakes up.

## 2020-10-20 NOTE — ED Triage Notes (Signed)
Pt comes into the ED via POV c/o numbness in bilateral arms and legs.  Pt states that she was seen here on 10/17/20 for neck pain and the numbness has started since then.  Pt ambulatory to triage with good gait at this time.

## 2020-10-20 NOTE — Discharge Instructions (Signed)
Your blood work was normal today.  Your potassium was back to normal, you can stop taking the potassium supplement.  Your thyroid function tests were normal as well.  I do not feel like your numbness is related to your neck pain.  I suspect that this may be in the setting of anxiety.  Please follow-up with your primary care provider if the symptoms continue.

## 2020-12-08 ENCOUNTER — Ambulatory Visit: Payer: Self-pay | Admitting: Cardiology

## 2020-12-15 ENCOUNTER — Ambulatory Visit: Payer: BLUE CROSS/BLUE SHIELD | Admitting: Gastroenterology

## 2020-12-15 ENCOUNTER — Encounter: Payer: Self-pay | Admitting: Gastroenterology

## 2021-09-05 ENCOUNTER — Other Ambulatory Visit: Payer: Self-pay

## 2021-09-05 ENCOUNTER — Emergency Department: Payer: Commercial Managed Care - PPO

## 2021-09-05 ENCOUNTER — Emergency Department
Admission: EM | Admit: 2021-09-05 | Discharge: 2021-09-05 | Disposition: A | Discharge status: Home / Self Care | Payer: Commercial Managed Care - PPO | Attending: Emergency Medicine | Admitting: Emergency Medicine

## 2021-09-05 DIAGNOSIS — R0789 Other chest pain: Secondary | ICD-10-CM | POA: Diagnosis not present

## 2021-09-05 DIAGNOSIS — R079 Chest pain, unspecified: Secondary | ICD-10-CM | POA: Diagnosis present

## 2021-09-05 LAB — CBC WITH DIFFERENTIAL/PLATELET
Abs Immature Granulocytes: 0.03 10*3/uL (ref 0.00–0.07)
Basophils Absolute: 0.1 10*3/uL (ref 0.0–0.1)
Basophils Relative: 1 %
Eosinophils Absolute: 0 10*3/uL (ref 0.0–0.5)
Eosinophils Relative: 0 %
HCT: 44.3 % (ref 36.0–46.0)
Hemoglobin: 15 g/dL (ref 12.0–15.0)
Immature Granulocytes: 0 %
Lymphocytes Relative: 16 %
Lymphs Abs: 1.6 10*3/uL (ref 0.7–4.0)
MCH: 30.5 pg (ref 26.0–34.0)
MCHC: 33.9 g/dL (ref 30.0–36.0)
MCV: 90.2 fL (ref 80.0–100.0)
Monocytes Absolute: 0.6 10*3/uL (ref 0.1–1.0)
Monocytes Relative: 6 %
Neutro Abs: 7.8 10*3/uL — ABNORMAL HIGH (ref 1.7–7.7)
Neutrophils Relative %: 77 %
Platelets: 247 10*3/uL (ref 150–400)
RBC: 4.91 MIL/uL (ref 3.87–5.11)
RDW: 11.8 % (ref 11.5–15.5)
WBC: 10.1 10*3/uL (ref 4.0–10.5)
nRBC: 0 % (ref 0.0–0.2)

## 2021-09-05 LAB — COMPREHENSIVE METABOLIC PANEL
ALT: 13 U/L (ref 0–44)
AST: 17 U/L (ref 15–41)
Albumin: 4.5 g/dL (ref 3.5–5.0)
Alkaline Phosphatase: 34 U/L — ABNORMAL LOW (ref 38–126)
Anion gap: 6 (ref 5–15)
BUN: 11 mg/dL (ref 6–20)
CO2: 28 mmol/L (ref 22–32)
Calcium: 9.4 mg/dL (ref 8.9–10.3)
Chloride: 106 mmol/L (ref 98–111)
Creatinine, Ser: 0.73 mg/dL (ref 0.44–1.00)
GFR, Estimated: >60 mL/min (ref >60)
Glucose, Bld: 84 mg/dL (ref 70–99)
Potassium: 3.9 mmol/L (ref 3.5–5.1)
Sodium: 140 mmol/L (ref 135–145)
Total Bilirubin: 1 mg/dL (ref 0.3–1.2)
Total Protein: 7.9 g/dL (ref 6.5–8.1)

## 2021-09-05 LAB — D-DIMER, QUANTITATIVE: D-Dimer, Quant: <0.27 ug/mL-FEU (ref 0.00–0.50)

## 2021-09-05 LAB — TROPONIN I (HIGH SENSITIVITY)
Troponin I (High Sensitivity): <2 ng/L (ref <18)
Troponin I (High Sensitivity): <2 ng/L (ref <18)

## 2021-09-05 LAB — POC URINE PREG, ED: Preg Test, Ur: NEGATIVE

## 2021-09-05 LAB — LIPASE, BLOOD: Lipase: 33 U/L (ref 11–51)

## 2021-09-05 NOTE — Discharge Instructions (Signed)
You can take 40 mg of Pepcid once daily. Exercise for at least 30 minutes a day. Spend at least 30 minutes outside in the sunshine. Drink at least 2 L of water a day.

## 2021-09-05 NOTE — ED Provider Triage Note (Signed)
  Emergency Medicine Provider Triage Evaluation Note  Allison Oliver , a 26 y.o.female,  was evaluated in triage.  Pt complains of chest pain x 3 days. Reports constant pain in middle of chest with intermittent pain underneath her ribs and in upper abdomen. Self reported hx of anxiety   Review of Systems  Positive: Chest pain  Negative: Denies fever, chest pain, vomiting  Physical Exam  There were no vitals filed for this visit. Gen:   Awake, no distress   Resp:  Normal effort  MSK:   Moves extremities without difficulty  Other:    Medical Decision Making  Given the patient's initial medical screening exam, the following diagnostic evaluation has been ordered. The patient will be placed in the appropriate treatment space, once one is available, to complete the evaluation and treatment. I have discussed the plan of care with the patient and I have advised the patient that an ED physician or mid-level practitioner will reevaluate their condition after the test results have been received, as the results may give them additional insight into the type of treatment they may need.    Diagnostics: labs, ekg, cxr  Treatments: none immediately   Varney Daily, [place] 09/05/21 1516

## 2021-09-05 NOTE — ED Triage Notes (Signed)
Pt complains of left sided chest pain x 2 days. Denies any Sob Denies any injury. PCP advised to take omeprazole and made appt to see patient in 2 days. Pt states she has anxiety and couldn't wait.

## 2021-09-05 NOTE — ED Notes (Signed)
Dc instructions thoroughly reviewed with pt no questions or concerns at this time will follow up with PCP on Wednesday.

## 2021-09-05 NOTE — ED Provider Notes (Signed)
[facility] Provider Note  Patient Contact: 6:26 PM (approximate)   History   Chest Pain   HPI  Allison Oliver is a 26 y.o. female with a history of ADHD and insomnia, presents to the emergency department with left-sided chest pain.  Patient states that her pain sometimes radiates to the back.  No associated shortness of breath.  Patient states that she messaged her primary care provider about her current symptoms.  He recommended omeprazole as he suspected that she had GERD.  She denies current chest tightness.  She has been afebrile and denies cough.  She denies daily smoking but states that she quit in October of last year.  She also has an IUD.  She did recently return from a trip to the beach.  No nausea, vomiting or abdominal pain.      Physical Exam   Triage Vital Signs: ED Triage Vitals  Enc Vitals Group     BP 09/05/21 1525 133/85     Pulse Rate 09/05/21 1525 99     Resp 09/05/21 1525 18     Temp 09/05/21 1525 97.9 F (36.6 C)     Temp Source 09/05/21 1525 Oral     SpO2 09/05/21 1525 98 %     Weight 09/05/21 1517 108 lb (49 kg)     Height 09/05/21 1517 5\' 3"  (1.6 m)     Head Circumference --      Peak Flow --      Pain Score 09/05/21 1516 8     Pain Loc --      Pain Edu? --      Excl. in GC? --     Most recent vital signs: Vitals:   09/05/21 1525 09/05/21 1828  BP: 133/85 130/80  Pulse: 99 90  Resp: 18 18  Temp: 97.9 F (36.6 C) 98 F (36.7 C)  SpO2: 98% 98%     General: Alert and in no acute distress. Eyes:  PERRL. EOMI. Head: No acute traumatic findings ENT:      Nose: No congestion/rhinnorhea.      Mouth/Throat: Mucous membranes are moist. Neck: No stridor. No cervical spine tenderness to palpation. Cardiovascular:  Good peripheral perfusion Respiratory: Normal respiratory effort without tachypnea or retractions. Lungs CTAB. Good air entry to the bases with no decreased or absent breath sounds. Gastrointestinal:  Bowel sounds 4 quadrants. Soft and nontender to palpation. No guarding or rigidity. No palpable masses. No distention. No CVA tenderness. Musculoskeletal: Full range of motion to all extremities.  Neurologic:  No gross focal neurologic deficits are appreciated.  Skin:   No rash noted Other:   ED Results / Procedures / Treatments   Labs (all labs ordered are listed, but only abnormal results are displayed) Labs Reviewed  CBC WITH DIFFERENTIAL/PLATELET - Abnormal; Notable for the following components:      Result Value   Neutro Abs 7.8 (*)    All other components within normal limits  COMPREHENSIVE METABOLIC PANEL - Abnormal; Notable for the following components:   Alkaline Phosphatase 34 (*)    All other components within normal limits  LIPASE, BLOOD  D-DIMER, QUANTITATIVE  POC URINE PREG, ED  TROPONIN I (HIGH SENSITIVITY)  TROPONIN I (HIGH SENSITIVITY)     EKG  Normal sinus rhythm without ST segment elevation or other apparent arrhythmia.   RADIOLOGY  I personally viewed and evaluated these images as part of my medical decision making, as well as reviewing the written report by  the radiologist.  ED Provider Interpretation: No consolidations, opacities or infiltrates.  No pneumothorax.  No pneumomediastinum.   PROCEDURES:  Critical Care performed: No  Procedures   MEDICATIONS ORDERED IN ED: Medications - No data to display   IMPRESSION / MDM / ASSESSMENT AND PLAN / ED COURSE  I reviewed the triage vital signs and the nursing notes.                              Assessment and plan:  Chest pain:  26 year old female presents to the emergency department with left-sided chest pain started on Friday that is occurred intermittently.  Vital signs are reassuring at triage.  On exam, patient was alert and nontoxic-appearing.  She had no reproducible tenderness to palpation and no increased work of breathing.  Differential diagnosis included pneumothorax, PE, acid  reflux, pregnancy...  CBC, CMP, troponin and lipase within range.  D-dimer within range.    EKG indicated normal sinus rhythm without ST segment elevation or other apparent arrhythmia.  Chest x-ray indicated no consolidations, opacities or infiltrates to suggest pneumonia.      FINAL CLINICAL IMPRESSION(S) / ED DIAGNOSES   Final diagnoses:  Other chest pain     Rx / DC Orders   ED Discharge Orders     None        Note:  This document was prepared using Dragon voice recognition software and may include unintentional dictation errors.   Pia Mau Atherton, PA-C 09/05/21 1950    Arnaldo Natal, MD 09/05/21 [phone]

## 2022-07-17 ENCOUNTER — Other Ambulatory Visit: Payer: Self-pay | Admitting: Internal Medicine

## 2022-07-17 DIAGNOSIS — G4452 New daily persistent headache (NDPH): Secondary | ICD-10-CM

## 2022-07-17 DIAGNOSIS — R519 Headache, unspecified: Secondary | ICD-10-CM

## 2022-07-28 ENCOUNTER — Ambulatory Visit
Admission: RE | Admit: 2022-07-28 | Discharge: 2022-07-28 | Disposition: A | Discharge status: Home / Self Care | Payer: 59 | Source: Ambulatory Visit | Attending: Internal Medicine | Admitting: Internal Medicine

## 2022-07-28 DIAGNOSIS — R519 Headache, unspecified: Secondary | ICD-10-CM | POA: Insufficient documentation

## 2022-07-28 DIAGNOSIS — G4452 New daily persistent headache (NDPH): Secondary | ICD-10-CM | POA: Diagnosis present

## 2023-06-21 ENCOUNTER — Emergency Department
Admission: EM | Admit: 2023-06-21 | Discharge: 2023-06-22 | Disposition: A | Discharge status: Home / Self Care | Attending: Emergency Medicine | Admitting: Emergency Medicine

## 2023-06-21 ENCOUNTER — Other Ambulatory Visit: Payer: Self-pay

## 2023-06-21 DIAGNOSIS — R1031 Right lower quadrant pain: Secondary | ICD-10-CM

## 2023-06-21 DIAGNOSIS — N3000 Acute cystitis without hematuria: Secondary | ICD-10-CM | POA: Diagnosis not present

## 2023-06-21 LAB — CBC WITH DIFFERENTIAL/PLATELET
Abs Immature Granulocytes: 0.01 10*3/uL (ref 0.00–0.07)
Basophils Absolute: 0 10*3/uL (ref 0.0–0.1)
Basophils Relative: 1 %
Eosinophils Absolute: 0.1 10*3/uL (ref 0.0–0.5)
Eosinophils Relative: 2 %
HCT: 41.2 % (ref 36.0–46.0)
Hemoglobin: 14.4 g/dL (ref 12.0–15.0)
Immature Granulocytes: 0 %
Lymphocytes Relative: 39 %
Lymphs Abs: 2.4 10*3/uL (ref 0.7–4.0)
MCH: 31.5 pg (ref 26.0–34.0)
MCHC: 35 g/dL (ref 30.0–36.0)
MCV: 90.2 fL (ref 80.0–100.0)
Monocytes Absolute: 0.4 10*3/uL (ref 0.1–1.0)
Monocytes Relative: 7 %
Neutro Abs: 3.2 10*3/uL (ref 1.7–7.7)
Neutrophils Relative %: 51 %
Platelets: 265 10*3/uL (ref 150–400)
RBC: 4.57 MIL/uL (ref 3.87–5.11)
RDW: 11.8 % (ref 11.5–15.5)
WBC: 6.2 10*3/uL (ref 4.0–10.5)
nRBC: 0 % (ref 0.0–0.2)

## 2023-06-21 LAB — URINALYSIS, ROUTINE W REFLEX MICROSCOPIC
Bilirubin Urine: NEGATIVE
Glucose, UA: NEGATIVE mg/dL
Ketones, ur: NEGATIVE mg/dL
Nitrite: NEGATIVE
Protein, ur: NEGATIVE mg/dL
Specific Gravity, Urine: 1.024 (ref 1.005–1.030)
pH: 6 (ref 5.0–8.0)

## 2023-06-21 LAB — BASIC METABOLIC PANEL WITH GFR
Anion gap: 12 (ref 5–15)
BUN: 13 mg/dL (ref 6–20)
CO2: 23 mmol/L (ref 22–32)
Calcium: 9.5 mg/dL (ref 8.9–10.3)
Chloride: 103 mmol/L (ref 98–111)
Creatinine, Ser: 0.68 mg/dL (ref 0.44–1.00)
GFR, Estimated: >60 mL/min (ref >60)
Glucose, Bld: 108 mg/dL — ABNORMAL HIGH (ref 70–99)
Potassium: 3.5 mmol/L (ref 3.5–5.1)
Sodium: 138 mmol/L (ref 135–145)

## 2023-06-21 LAB — POC URINE PREG, ED: Preg Test, Ur: NEGATIVE

## 2023-06-21 MED ORDER — CEPHALEXIN 500 MG PO CAPS
500.0000 mg | ORAL_CAPSULE | Freq: Once | ORAL | Status: DC
  Filled 2023-06-21: qty 1

## 2023-06-21 NOTE — ED Triage Notes (Signed)
 Pt reports right side pelvic pain that began earlier today, pt denies vaginal bleeding or abnormal discharge. Pt states she has had kidney infection before and this feels similar.

## 2023-06-22 ENCOUNTER — Emergency Department

## 2023-06-22 MED ORDER — CEPHALEXIN 500 MG PO CAPS
500.0000 mg | ORAL_CAPSULE | Freq: Once | ORAL | Status: AC
  Administered 2023-06-22: 500 mg via ORAL

## 2023-06-22 MED ORDER — CEPHALEXIN 500 MG PO CAPS: 500.0000 mg | ORAL_CAPSULE | Freq: Three times a day (TID) | ORAL | 0 refills | Status: AC

## 2023-06-22 MED ORDER — KETOROLAC TROMETHAMINE 30 MG/ML IJ SOLN
15.0000 mg | Freq: Once | INTRAMUSCULAR | Status: AC
  Administered 2023-06-22: 15 mg via INTRAVENOUS
  Filled 2023-06-22: qty 1

## 2023-06-22 MED ORDER — IOHEXOL 300 MG/ML  SOLN
100.0000 mL | Freq: Once | INTRAMUSCULAR | Status: AC | PRN
Start: 2023-06-22 — End: 2023-06-22
  Administered 2023-06-22: 100 mL via INTRAVENOUS

## 2023-06-22 NOTE — Discharge Instructions (Signed)
 Keflex  antibiotic 3 times daily for 5 days to treat a urinary infection/UTI.  Take all 15 pills, even if you are feeling better.   Please take Tylenol and ibuprofen/Advil for your pain.  It is safe to take them together, or to alternate them every few hours.  Take up to 1000mg  of Tylenol at a time, up to 4 times per day.  Do not take more than 4000 mg of Tylenol in 24 hours.  For ibuprofen, take 400-600 mg, 3 - 4 times per day.

## 2023-06-22 NOTE — ED Provider Notes (Signed)
 [facility] Provider Note    Event Date/Time   First MD Initiated Contact with Patient 06/21/23 2346     (approximate)   History   Pelvic Pain   HPI  Allison Oliver is a 28 y.o. female who presents to the ED for evaluation of Pelvic Pain   Review a [facility] gynecology visit from 5/28, Mirena IUD removal.  G1, P1.  No history of abdominal surgeries.  Patient presents for about 12 hours of RLQ abdominal pain and atraumatic right anterior leg/thigh pain.  No vaginal discharge or bleeding since IUD removal.  No urinary symptoms such as dysuria, frequency, incontinence or hematuria.  No stool changes.  No nausea or emesis.  No fevers.  Just pain   Physical Exam   Triage Vital Signs: ED Triage Vitals  Encounter Vitals Group     BP 06/21/23 2218 130/78     Girls Systolic BP Percentile --      Girls Diastolic BP Percentile --      Boys Systolic BP Percentile --      Boys Diastolic BP Percentile --      Pulse Rate 06/21/23 2218 98     Resp 06/21/23 2218 18     Temp 06/21/23 2218 98.5 F (36.9 C)     Temp src --      SpO2 06/21/23 2218 100 %     Weight 06/21/23 2217 111 lb (50.3 kg)     Height 06/21/23 2217 5' 3 (1.6 m)     Head Circumference --      Peak Flow --      Pain Score 06/21/23 2217 8     Pain Loc --      Pain Education --      Exclude from Growth Chart --     Most recent vital signs: Vitals:   06/21/23 2218  BP: 130/78  Pulse: 98  Resp: 18  Temp: 98.5 F (36.9 C)  SpO2: 100%    General: Awake, no distress.  CV:  Good peripheral perfusion.  Resp:  Normal effort.  Abd:  No distention.  RLQ tenderness is present near McBurney's point.  Benign upper abdomen.  No guarding or peritoneal features MSK:  No deformity noted.  Neuro:  No focal deficits appreciated. Other:     ED Results / Procedures / Treatments   Labs (all labs ordered are listed, but only abnormal results are displayed) Labs Reviewed  BASIC METABOLIC PANEL  WITH GFR - Abnormal; Notable for the following components:      Result Value   Glucose, Bld 108 (*)    All other components within normal limits  URINALYSIS, ROUTINE W REFLEX MICROSCOPIC - Abnormal; Notable for the following components:   Color, Urine YELLOW (*)    APPearance HAZY (*)    Hgb urine dipstick SMALL (*)    Leukocytes,Ua TRACE (*)    Bacteria, UA RARE (*)    All other components within normal limits  URINE CULTURE  CBC WITH DIFFERENTIAL/PLATELET  POC URINE PREG, ED    EKG   RADIOLOGY CT abdomen/pelvis interpreted by me without evidence of acute pathology  Official radiology report(s): CT ABDOMEN PELVIS W CONTRAST Result Date: 06/22/2023 CLINICAL DATA:  Right lower quadrant pain EXAM: CT ABDOMEN AND PELVIS WITH CONTRAST TECHNIQUE: Multidetector CT imaging of the abdomen and pelvis was performed using the standard protocol following bolus administration of intravenous contrast. RADIATION DOSE REDUCTION: This exam was performed according to the departmental dose-optimization  program which includes automated exposure control, adjustment of the mA and/or kV according to patient size and/or use of iterative reconstruction technique. CONTRAST:  100mL OMNIPAQUE IOHEXOL 300 MG/ML  SOLN COMPARISON:  None Available. FINDINGS: Lower chest: Lung bases are within normal limits. Hepatobiliary: No focal liver abnormality is seen. No gallstones, gallbladder wall thickening, or biliary dilatation. Pancreas: Unremarkable. No pancreatic ductal dilatation or surrounding inflammatory changes. Spleen: Normal in size without focal abnormality. Adrenals/Urinary Tract: Adrenal glands are within normal limits. Left kidney demonstrates normal enhancement. Nonobstructing renal calculi are noted in the lower pole measuring 3 mm and smaller. No obstructive changes are noted. Bladder is within normal limits. Stomach/Bowel: No obstructive or inflammatory changes of the colon are noted. Scattered fecal material is  noted throughout the colon. The appendix is within normal limits. Stomach and small bowel are within normal limits. Vascular/Lymphatic: No significant vascular findings are present. No enlarged abdominal or pelvic lymph nodes. Reproductive: Uterus and bilateral adnexa are unremarkable. Other: No abdominal wall hernia or abnormality. No abdominopelvic ascites. Musculoskeletal: No acute or significant osseous findings. IMPRESSION: Nonobstructing left renal stones. No other focal abnormality is noted. Specifically the appendix is within normal limits. Electronically Signed   By: Violeta Grey M.D.   On: 06/22/2023 01:02    PROCEDURES and INTERVENTIONS:  Procedures  Medications  cephALEXin  (KEFLEX ) capsule 500 mg (has no administration in time range)  ketorolac  (TORADOL ) 30 MG/ML injection 15 mg (15 mg Intravenous Given 06/22/23 0030)  iohexol (OMNIPAQUE) 300 MG/ML solution 100 mL (100 mLs Intravenous Contrast Given 06/22/23 0047)     IMPRESSION / MDM / ASSESSMENT AND PLAN / ED COURSE  I reviewed the triage vital signs and the nursing notes.  Differential diagnosis includes, but is not limited to, acute appendicitis, pyelonephritis, UTI, PID, torsion  {Patient presents with symptoms of an acute illness or injury that is potentially life-threatening.  Patient presents with RLQ pain with possible source being cystitis, with an otherwise benign workup and suitable for outpatient management.  Look systemically well but has localized RLQ tenderness.  Blood work is benign with a normal CBC, metabolic panel.  Urine with leukocytes, bacteria concerning for possible cystitis and this is sent for culture.  She was started on antibiotics.  CT is reassuring.  Doubt PID due to lack of discharge, bleeding.  Discussed close return precautions  Clinical Course as of 06/22/23 0116  Fri Jun 22, 2023  0115 Reassessed and discussed reassuring CT scan and possible etiologies of her symptoms.  We discussed antibiotics  for UTI and outpatient management.  Reports her pain is resolved after the Toradol  and she is appreciative. [DS]    Clinical Course User Index [DS] Arline Bennett, MD     FINAL CLINICAL IMPRESSION(S) / ED DIAGNOSES   Final diagnoses:  RLQ abdominal pain  Acute cystitis without hematuria     Rx / DC Orders   ED Discharge Orders          Ordered    cephALEXin  (KEFLEX ) 500 MG capsule  3 times daily        06/22/23 0115             Note:  This document was prepared using Dragon voice recognition software and may include unintentional dictation errors.   Arline Bennett, MD 06/22/23 [phone]

## 2023-06-23 LAB — URINE CULTURE: Culture: 60000 — AB

## 2023-08-21 ENCOUNTER — Other Ambulatory Visit: Payer: Self-pay | Admitting: Family Medicine

## 2023-08-21 DIAGNOSIS — N6321 Unspecified lump in the left breast, upper outer quadrant: Secondary | ICD-10-CM

## 2023-08-22 ENCOUNTER — Ambulatory Visit
Admission: RE | Admit: 2023-08-22 | Discharge: 2023-08-22 | Disposition: A | Discharge status: Home / Self Care | Source: Ambulatory Visit | Attending: Family Medicine | Admitting: Family Medicine

## 2023-08-22 DIAGNOSIS — N6321 Unspecified lump in the left breast, upper outer quadrant: Secondary | ICD-10-CM | POA: Diagnosis present
